# Patient Record
Sex: Female | Born: 1955 | Race: White | Hispanic: No | Marital: Married | Smoking: Never smoker
Health system: Southern US, Community
[De-identification: ages and names within clinical notes are randomized; demographics above are authoritative.]

## PROBLEM LIST (undated history)

## (undated) DIAGNOSIS — Z86010 Personal history of colonic polyps: Secondary | ICD-10-CM

## (undated) DIAGNOSIS — J309 Allergic rhinitis, unspecified: Secondary | ICD-10-CM

## (undated) DIAGNOSIS — J45909 Unspecified asthma, uncomplicated: Secondary | ICD-10-CM

## (undated) DIAGNOSIS — L570 Actinic keratosis: Secondary | ICD-10-CM

## (undated) DIAGNOSIS — K5730 Diverticulosis of large intestine without perforation or abscess without bleeding: Secondary | ICD-10-CM

## (undated) DIAGNOSIS — J069 Acute upper respiratory infection, unspecified: Secondary | ICD-10-CM

## (undated) DIAGNOSIS — E079 Disorder of thyroid, unspecified: Secondary | ICD-10-CM

## (undated) DIAGNOSIS — I10 Essential (primary) hypertension: Secondary | ICD-10-CM

## (undated) HISTORY — DX: Actinic keratosis: L57.0

## (undated) HISTORY — DX: Essential (primary) hypertension: I10

## (undated) HISTORY — DX: Allergic rhinitis, unspecified: J30.9

## (undated) HISTORY — DX: Personal history of colonic polyps: Z86.010

## (undated) HISTORY — DX: Acute upper respiratory infection, unspecified: J06.9

## (undated) HISTORY — DX: Unspecified asthma, uncomplicated: J45.909

## (undated) HISTORY — DX: Disorder of thyroid, unspecified: E07.9

## (undated) HISTORY — DX: Diverticulosis of large intestine without perforation or abscess without bleeding: K57.30

---

## 1997-07-10 ENCOUNTER — Ambulatory Visit (HOSPITAL_COMMUNITY): Admission: RE | Admit: 1997-07-10 | Discharge: 1997-07-10 | Payer: Self-pay | Admitting: Internal Medicine

## 1999-05-02 ENCOUNTER — Encounter: Admission: RE | Admit: 1999-05-02 | Discharge: 1999-05-02 | Payer: Self-pay | Admitting: Gynecology

## 1999-05-02 ENCOUNTER — Encounter: Payer: Self-pay | Admitting: Gynecology

## 2001-01-18 ENCOUNTER — Other Ambulatory Visit: Admission: RE | Admit: 2001-01-18 | Discharge: 2001-01-18 | Payer: Self-pay | Admitting: Gynecology

## 2003-02-18 ENCOUNTER — Encounter: Admission: RE | Admit: 2003-02-18 | Discharge: 2003-02-18 | Payer: Self-pay | Admitting: Gynecology

## 2003-03-18 ENCOUNTER — Other Ambulatory Visit: Admission: RE | Admit: 2003-03-18 | Discharge: 2003-03-18 | Payer: Self-pay | Admitting: Gynecology

## 2003-12-21 ENCOUNTER — Ambulatory Visit: Payer: Self-pay | Admitting: Family Medicine

## 2004-01-02 ENCOUNTER — Ambulatory Visit: Payer: Self-pay | Admitting: Family Medicine

## 2004-08-04 ENCOUNTER — Encounter: Admission: RE | Admit: 2004-08-04 | Discharge: 2004-08-04 | Payer: Self-pay | Admitting: Gynecology

## 2004-08-10 ENCOUNTER — Other Ambulatory Visit: Admission: RE | Admit: 2004-08-10 | Discharge: 2004-08-10 | Payer: Self-pay | Admitting: Gynecology

## 2004-08-11 ENCOUNTER — Encounter: Admission: RE | Admit: 2004-08-11 | Discharge: 2004-08-11 | Payer: Self-pay | Admitting: Gynecology

## 2005-09-13 ENCOUNTER — Ambulatory Visit: Payer: Self-pay | Admitting: Internal Medicine

## 2005-11-02 ENCOUNTER — Ambulatory Visit: Payer: Self-pay | Admitting: Internal Medicine

## 2005-11-02 LAB — CONVERTED CEMR LAB
AST: 25 units/L (ref 0–37)
BUN: 12 mg/dL (ref 6–23)
Basophils Relative: 1.6 % — ABNORMAL HIGH (ref 0.0–1.0)
Calcium: 9.2 mg/dL (ref 8.4–10.5)
Chloride: 104 meq/L (ref 96–112)
Creatinine, Ser: 0.9 mg/dL (ref 0.4–1.2)
Eosinophil percent: 4.5 % (ref 0.0–5.0)
HCT: 38.9 % (ref 36.0–46.0)
HDL: 99.2 mg/dL (ref >39.0)
Hemoglobin: 12.9 g/dL (ref 12.0–15.0)
LDL DIRECT: 106.3 mg/dL
Lymphocytes Relative: 27.6 % (ref 12.0–46.0)
MCHC: 33.3 g/dL (ref 30.0–36.0)
MCV: 92.1 fL (ref 78.0–100.0)
Monocytes Absolute: 0.3 10*3/uL (ref 0.2–0.7)
Neutro Abs: 1.9 10*3/uL (ref 1.4–7.7)
Neutrophils Relative %: 56.8 % (ref 43.0–77.0)
VLDL: 16 mg/dL (ref 0–40)

## 2005-11-09 ENCOUNTER — Ambulatory Visit: Payer: Self-pay | Admitting: Internal Medicine

## 2006-01-23 ENCOUNTER — Ambulatory Visit: Payer: Self-pay | Admitting: Gastroenterology

## 2006-03-13 ENCOUNTER — Encounter (INDEPENDENT_AMBULATORY_CARE_PROVIDER_SITE_OTHER): Payer: Self-pay | Admitting: Specialist

## 2006-03-13 ENCOUNTER — Ambulatory Visit: Payer: Self-pay | Admitting: Gastroenterology

## 2006-05-08 ENCOUNTER — Ambulatory Visit: Payer: Self-pay | Admitting: Internal Medicine

## 2006-11-07 DIAGNOSIS — K5730 Diverticulosis of large intestine without perforation or abscess without bleeding: Secondary | ICD-10-CM

## 2006-11-07 DIAGNOSIS — J45909 Unspecified asthma, uncomplicated: Secondary | ICD-10-CM

## 2006-11-07 DIAGNOSIS — J3089 Other allergic rhinitis: Secondary | ICD-10-CM | POA: Insufficient documentation

## 2006-11-07 DIAGNOSIS — Z860100 Personal history of colon polyps, unspecified: Secondary | ICD-10-CM

## 2006-11-07 DIAGNOSIS — J4530 Mild persistent asthma, uncomplicated: Secondary | ICD-10-CM | POA: Insufficient documentation

## 2006-11-07 DIAGNOSIS — J309 Allergic rhinitis, unspecified: Secondary | ICD-10-CM

## 2006-11-07 DIAGNOSIS — J4540 Moderate persistent asthma, uncomplicated: Secondary | ICD-10-CM

## 2006-11-07 HISTORY — DX: Diverticulosis of large intestine without perforation or abscess without bleeding: K57.30

## 2006-11-07 HISTORY — DX: Unspecified asthma, uncomplicated: J45.909

## 2006-11-07 HISTORY — DX: Allergic rhinitis, unspecified: J30.9

## 2006-11-07 HISTORY — DX: Personal history of colonic polyps: Z86.010

## 2006-11-07 HISTORY — DX: Personal history of colon polyps, unspecified: Z86.0100

## 2006-11-13 ENCOUNTER — Ambulatory Visit: Payer: Self-pay | Admitting: Internal Medicine

## 2006-11-13 LAB — CONVERTED CEMR LAB
ALT: 20 units/L (ref 0–35)
AST: 19 units/L (ref 0–37)
Albumin: 4 g/dL (ref 3.5–5.2)
Alkaline Phosphatase: 61 units/L (ref 39–117)
BUN: 18 mg/dL (ref 6–23)
Basophils Absolute: 0 10*3/uL (ref 0.0–0.1)
Bilirubin Urine: NEGATIVE
Blood in Urine, dipstick: NEGATIVE
Calcium: 9.6 mg/dL (ref 8.4–10.5)
Chloride: 104 meq/L (ref 96–112)
Cholesterol: 245 mg/dL (ref 0–200)
Eosinophils Absolute: 0.4 10*3/uL (ref 0.0–0.6)
Eosinophils Relative: 11.3 % — ABNORMAL HIGH (ref 0.0–5.0)
GFR calc Af Amer: 85 mL/min
GFR calc non Af Amer: 70 mL/min
Glucose, Bld: 104 mg/dL — ABNORMAL HIGH (ref 70–99)
HDL: 89.5 mg/dL (ref >39.0)
Ketones, urine, test strip: NEGATIVE
MCV: 91.1 fL (ref 78.0–100.0)
Monocytes Relative: 9.1 % (ref 3.0–11.0)
Neutro Abs: 2.1 10*3/uL (ref 1.4–7.7)
Nitrite: NEGATIVE
Platelets: 229 10*3/uL (ref 150–400)
RBC: 4.3 M/uL (ref 3.87–5.11)
Triglycerides: 125 mg/dL (ref 0–149)
Urobilinogen, UA: 0.2
WBC: 3.8 10*3/uL — ABNORMAL LOW (ref 4.5–10.5)

## 2006-12-03 ENCOUNTER — Ambulatory Visit: Payer: Self-pay | Admitting: Internal Medicine

## 2006-12-13 ENCOUNTER — Telehealth: Payer: Self-pay | Admitting: Internal Medicine

## 2006-12-14 ENCOUNTER — Ambulatory Visit: Payer: Self-pay | Admitting: Internal Medicine

## 2007-01-10 ENCOUNTER — Ambulatory Visit: Payer: Self-pay | Admitting: Internal Medicine

## 2007-01-10 LAB — CONVERTED CEMR LAB: TSH: 2.86 microintl units/mL (ref 0.35–5.50)

## 2007-01-23 ENCOUNTER — Telehealth: Payer: Self-pay | Admitting: Internal Medicine

## 2007-09-26 ENCOUNTER — Ambulatory Visit: Payer: Self-pay | Admitting: Internal Medicine

## 2007-11-06 ENCOUNTER — Encounter: Payer: Self-pay | Admitting: Internal Medicine

## 2007-11-13 ENCOUNTER — Telehealth (INDEPENDENT_AMBULATORY_CARE_PROVIDER_SITE_OTHER): Payer: Self-pay | Admitting: *Deleted

## 2007-11-15 ENCOUNTER — Encounter: Admission: RE | Admit: 2007-11-15 | Discharge: 2007-11-15 | Payer: Self-pay | Admitting: Gynecology

## 2007-12-07 ENCOUNTER — Ambulatory Visit: Payer: Self-pay | Admitting: Family Medicine

## 2007-12-11 ENCOUNTER — Encounter: Payer: Self-pay | Admitting: Internal Medicine

## 2007-12-11 ENCOUNTER — Ambulatory Visit: Payer: Self-pay | Admitting: Internal Medicine

## 2008-01-10 ENCOUNTER — Ambulatory Visit: Payer: Self-pay | Admitting: Internal Medicine

## 2008-01-10 LAB — CONVERTED CEMR LAB
Basophils Absolute: 0 10*3/uL (ref 0.0–0.1)
Basophils Relative: 1.2 % (ref 0.0–3.0)
Bilirubin Urine: NEGATIVE
Bilirubin, Direct: 0.1 mg/dL (ref 0.0–0.3)
Calcium: 9.3 mg/dL (ref 8.4–10.5)
Cholesterol: 244 mg/dL (ref 0–200)
Creatinine, Ser: 0.9 mg/dL (ref 0.4–1.2)
Direct LDL: 118 mg/dL
Eosinophils Absolute: 0.3 10*3/uL (ref 0.0–0.7)
GFR calc non Af Amer: 70 mL/min
HDL: 100 mg/dL (ref >39.0)
Ketones, ur: NEGATIVE mg/dL
Leukocytes, UA: NEGATIVE
Lymphocytes Relative: 32.2 % (ref 12.0–46.0)
MCHC: 35.2 g/dL (ref 30.0–36.0)
MCV: 89.5 fL (ref 78.0–100.0)
Neutro Abs: 1.7 10*3/uL (ref 1.4–7.7)
Neutrophils Relative %: 49.7 % (ref 43.0–77.0)
RBC: 4.26 M/uL (ref 3.87–5.11)
RDW: 12.5 % (ref 11.5–14.6)
Sodium: 143 meq/L (ref 135–145)
Specific Gravity, Urine: 1.02 (ref 1.000–1.03)
TSH: 3.74 microintl units/mL (ref 0.35–5.50)
Total Bilirubin: 0.7 mg/dL (ref 0.3–1.2)
Total CHOL/HDL Ratio: 2.4
Triglycerides: 93 mg/dL (ref 0–149)
Urine Glucose: NEGATIVE mg/dL
Urobilinogen, UA: 0.2 (ref 0.0–1.0)
VLDL: 19 mg/dL (ref 0–40)

## 2008-01-17 ENCOUNTER — Ambulatory Visit: Payer: Self-pay | Admitting: Internal Medicine

## 2008-01-17 DIAGNOSIS — L570 Actinic keratosis: Secondary | ICD-10-CM

## 2008-01-17 HISTORY — DX: Actinic keratosis: L57.0

## 2008-01-17 LAB — HM MAMMOGRAPHY: HM Mammogram: NORMAL

## 2008-01-20 ENCOUNTER — Telehealth: Payer: Self-pay | Admitting: Internal Medicine

## 2008-04-06 ENCOUNTER — Telehealth: Payer: Self-pay | Admitting: Internal Medicine

## 2008-05-07 ENCOUNTER — Telehealth: Payer: Self-pay | Admitting: Internal Medicine

## 2008-06-08 ENCOUNTER — Ambulatory Visit: Payer: Self-pay | Admitting: Internal Medicine

## 2008-09-22 ENCOUNTER — Telehealth (INDEPENDENT_AMBULATORY_CARE_PROVIDER_SITE_OTHER): Payer: Self-pay | Admitting: *Deleted

## 2009-03-22 ENCOUNTER — Ambulatory Visit: Payer: Self-pay | Admitting: Family Medicine

## 2009-08-24 ENCOUNTER — Telehealth: Payer: Self-pay | Admitting: Internal Medicine

## 2009-11-15 ENCOUNTER — Telehealth: Payer: Self-pay | Admitting: Internal Medicine

## 2010-01-29 ENCOUNTER — Encounter: Payer: Self-pay | Admitting: *Deleted

## 2010-01-30 LAB — CONVERTED CEMR LAB: Pap Smear: NORMAL

## 2010-01-31 ENCOUNTER — Encounter: Payer: Self-pay | Admitting: Internal Medicine

## 2010-02-01 NOTE — Progress Notes (Signed)
Summary: Physical results.  Phone Note Call from Patient   Caller: Patient Call For: Stacie Glaze MD Summary of Call: Pt and husband needs information off of last physical including labs, pulse rate, BP, Total Chol, HDL, LDL, glucose, Trig, Body Percent Fat.  Call when ready, please. Husband is Markus Daft.  This is for Health Assessment at work.  [phone] Initial call taken by: Lynann Beaver CMA,  August 24, 2009 2:50 PM  Follow-up for Phone Call        pt aware- they are ready for pick up Follow-up by: Willy Eddy, LPN,  August 24, 2009 3:30 PM

## 2010-02-01 NOTE — Progress Notes (Signed)
Summary: Pt req new script for Nasonex to [facility]  Phone Note Refill Request Message from:  Patient on November 15, 2009 9:14 AM  Refills Requested: Medication #1:  NASONEX 50 MCG/ACT  SUSP 2 spray  each nostril two times a day   Dosage confirmed as above?Dosage Confirmed  Method Requested: Telephone to [facility] Initial call taken by: Lucy Antigua,  November 15, 2009 9:14 AM    Prescriptions: NASONEX 50 MCG/ACT  SUSP (MOMETASONE FUROATE) 2 spray  each nostril two times a day  #1 x 2   Entered by:   Lynann Beaver CMA AAMA   Authorized by:   Stacie Glaze MD   Signed by:   Lynann Beaver CMA AAMA on 11/15/2009   Method used:   Electronically to        [facility]* (retail)             ,          Ph: [phone]       Fax: [phone]   RxID:   [id number]

## 2010-02-01 NOTE — Assessment & Plan Note (Signed)
Summary: COUGH, CONGESTION // RS   Vital Signs:  Patient profile:   55 year old female Temp:     99.9 degrees F BP sitting:   160 / 84  History of Present Illness: Patient has history of asthma. Onset over one week ago of cough with clear sputum and clear nasal mucus. Husband and one of her children had very similar symptoms. She has history of asthma and has recently over the past day started taking Qvar twice daily. No use of rescue inhaler. No significant dyspnea and denies any fever or chills.  Patient has some allergy symptoms generally this time of year. Taking Clarinex inconsistently and currently off Astelin.  Allergies: No Known Drug Allergies  Past History:  Past Medical History: Last updated: 11/07/2006 Allergic rhinitis Asthma Colonic polyps, hx of Diverticulosis, colon  Family History: Last updated: 09/26/2007 father with gout and Family History of Arthritis mother alive and well father has a bladder diverticula and now bladder cancer  Social History: Last updated: 01/10/2007 Married Never Smoked PMH-FH-SH reviewed for relevance  Review of Systems  The patient denies fever, chest pain, syncope, dyspnea on exertion, peripheral edema, prolonged cough, and hemoptysis.    Physical Exam  General:  Well-developed,well-nourished,in no acute distress; alert,appropriate and cooperative throughout examination coughing off and on during exam Ears:  small left serous effusion. Right eardrum normal Nose:  External nasal examination shows no deformity or inflammation. Nasal mucosa are pink and moist without lesions or exudates. Mouth:  Oral mucosa and oropharynx without lesions or exudates.  Teeth in good repair. Neck:  No deformities, masses, or tenderness noted. Lungs:  few very faint wheezes noted. No rales. No retractions. Symmetric breath sounds. Heart:  Normal rate and regular rhythm. S1 and S2 normal without gallop, murmur, click, rub or other extra  sounds.   Impression & Recommendations:  Problem # 1:  ASTHMA (ICD-493.90) suspect viral bronchitis trigger. No respiratory distress. Increase Qvar to 80 micrograms twice daily. Get back on regular use of Astelin and Clarinex. Refill Proventil 4 p.r.n. use The following medications were removed from the medication list:    Qvar 40 Mcg/act Aers (Beclomethasone dipropionate) ..... One puff by mouth daily Her updated medication list for this problem includes:    Qvar 80 Mcg/act Aers (Beclomethasone dipropionate) .Marland Kitchen... 1 puff two times a day    Proventil Hfa 108 (90 Base) Mcg/act Aers (Albuterol sulfate) .Marland Kitchen... 2 puffs every 4 hours as needed  Problem # 2:  ACUTE SEROUS OTITIS MEDIA (ICD-381.01) suspect related to recent virus. Observe for now  Complete Medication List: 1)  Clarinex 5 Mg Tabs (Desloratadine) .Marland Kitchen.. 1 once daily 2)  Nasonex 50 Mcg/act Susp (Mometasone furoate) .... 2 spray  each nostril two times a day 3)  Qvar 80 Mcg/act Aers (Beclomethasone dipropionate) .Marland Kitchen.. 1 puff two times a day 4)  Astelin 137 Mcg/spray Soln (Azelastine hcl) .... One spray once daily as needed 5)  Caltrate 600+d Plus 600-400 Mg-unit Tabs (Calcium carbonate-vit d-min) .... One by mouth bid 6)  Proventil Hfa 108 (90 Base) Mcg/act Aers (Albuterol sulfate) .... 2 puffs every 4 hours as needed  Patient Instructions: 1)  Increase Qvar to one puff twice daily 2)  use Proventil inhaler as needed for cough and wheezing 3)  Follow up immediately if you develop any fever or worsening respiratory symptoms 4)  Get back on regular use of Astelin and Clarinex Prescriptions: PROVENTIL HFA 108 (90 BASE) MCG/ACT AERS (ALBUTEROL SULFATE) 2 puffs every 4 hours as  needed  #1 x 1   Entered and Authorized by:   Evelena Peat MD   Signed by:   Evelena Peat MD on 03/22/2009   Method used:   Electronically to        [facility]* (retail)       [address]       [place], [place]   [address]       Ph: [phone] or [phone]       Fax: [phone]   RxID:   [id number]

## 2010-02-02 ENCOUNTER — Ambulatory Visit: Admit: 2010-02-02 | Payer: Self-pay | Admitting: Internal Medicine

## 2010-02-02 ENCOUNTER — Other Ambulatory Visit: Payer: Self-pay

## 2010-02-03 ENCOUNTER — Other Ambulatory Visit: Payer: Self-pay | Admitting: Internal Medicine

## 2010-02-03 ENCOUNTER — Encounter (INDEPENDENT_AMBULATORY_CARE_PROVIDER_SITE_OTHER): Payer: Self-pay | Admitting: *Deleted

## 2010-02-03 ENCOUNTER — Other Ambulatory Visit: Payer: BC Managed Care – PPO

## 2010-02-03 DIAGNOSIS — E785 Hyperlipidemia, unspecified: Secondary | ICD-10-CM

## 2010-02-03 DIAGNOSIS — Z0000 Encounter for general adult medical examination without abnormal findings: Secondary | ICD-10-CM

## 2010-02-03 LAB — BASIC METABOLIC PANEL
BUN: 21 mg/dL (ref 6–23)
Chloride: 102 mEq/L (ref 96–112)
Potassium: 4.8 mEq/L (ref 3.5–5.1)
Sodium: 138 mEq/L (ref 135–145)

## 2010-02-03 LAB — CBC WITH DIFFERENTIAL/PLATELET
Basophils Relative: 0.9 % (ref 0.0–3.0)
Eosinophils Relative: 8.1 % — ABNORMAL HIGH (ref 0.0–5.0)
HCT: 40.4 % (ref 36.0–46.0)
Hemoglobin: 14.4 g/dL (ref 12.0–15.0)
Lymphs Abs: 1.2 10*3/uL (ref 0.7–4.0)
MCV: 91.9 fl (ref 78.0–100.0)
Monocytes Absolute: 0.3 10*3/uL (ref 0.1–1.0)
Monocytes Relative: 7.9 % (ref 3.0–12.0)
Neutro Abs: 2.2 10*3/uL (ref 1.4–7.7)
Platelets: 204 10*3/uL (ref 150.0–400.0)
RBC: 4.4 Mil/uL (ref 3.87–5.11)
WBC: 4.1 10*3/uL — ABNORMAL LOW (ref 4.5–10.5)

## 2010-02-03 LAB — LIPID PANEL
HDL: 101.8 mg/dL (ref >39.00)
Triglycerides: 138 mg/dL (ref 0.0–149.0)

## 2010-02-03 LAB — URINALYSIS
Hgb urine dipstick: NEGATIVE
Total Protein, Urine: NEGATIVE
Urine Glucose: NEGATIVE
pH: 6 (ref 5.0–8.0)

## 2010-02-03 LAB — LDL CHOLESTEROL, DIRECT: Direct LDL: 120.3 mg/dL

## 2010-02-03 LAB — HEPATIC FUNCTION PANEL
ALT: 19 U/L (ref 0–35)
AST: 20 U/L (ref 0–37)
Albumin: 4.2 g/dL (ref 3.5–5.2)
Total Protein: 6.6 g/dL (ref 6.0–8.3)

## 2010-02-03 LAB — TSH: TSH: 5.05 u[IU]/mL (ref 0.35–5.50)

## 2010-02-09 ENCOUNTER — Encounter: Payer: Self-pay | Admitting: Internal Medicine

## 2010-05-18 ENCOUNTER — Encounter: Payer: Self-pay | Admitting: Internal Medicine

## 2010-06-30 ENCOUNTER — Telehealth: Payer: Self-pay | Admitting: Internal Medicine

## 2010-06-30 NOTE — Telephone Encounter (Signed)
Will have to ask dr Lovell Sheehan when he returns Friday- not listed anywhere inc hart

## 2010-06-30 NOTE — Telephone Encounter (Signed)
Pt is req a renewal of Ambien (unsure of dosage amount) Pt said that she had gotten script for this from Dr Lovell Sheehan a few years ago, when she was traveling to [place]. Pls call in to [facility] on [address].

## 2010-07-01 MED ORDER — ZOLPIDEM TARTRATE ER 12.5 MG PO TBCR: 12.5000 mg | EXTENDED_RELEASE_TABLET | Freq: Every evening | ORAL | Status: DC | PRN

## 2010-07-01 NOTE — Telephone Encounter (Signed)
may have ambien  12.5mg  1 qhs prn #30 with 1 refill and must have ov visit before anymore refills- has not been seen since 03-2010 per dr Lovell Sheehan

## 2010-07-01 NOTE — Telephone Encounter (Signed)
Left message for pt to pick up meds

## 2010-08-01 ENCOUNTER — Ambulatory Visit (INDEPENDENT_AMBULATORY_CARE_PROVIDER_SITE_OTHER): Payer: BC Managed Care – PPO | Admitting: Internal Medicine

## 2010-08-01 ENCOUNTER — Encounter: Payer: Self-pay | Admitting: Internal Medicine

## 2010-08-01 VITALS — BP 144/82 | HR 76 | Temp 98.2°F | Resp 14 | Ht 65.5 in | Wt 194.0 lb

## 2010-08-01 DIAGNOSIS — Z0000 Encounter for general adult medical examination without abnormal findings: Secondary | ICD-10-CM

## 2010-08-01 MED ORDER — AZELASTINE HCL 0.1 % NA SOLN: 1.0000 | Freq: Every day | NASAL | Status: DC | PRN

## 2010-08-01 MED ORDER — BECLOMETHASONE DIPROPIONATE 80 MCG/ACT IN AERS: 1.0000 | INHALATION_SPRAY | Freq: Two times a day (BID) | RESPIRATORY_TRACT | Status: DC

## 2010-08-01 MED ORDER — ZOLPIDEM TARTRATE ER 12.5 MG PO TBCR: 12.5000 mg | EXTENDED_RELEASE_TABLET | Freq: Every evening | ORAL | Status: DC | PRN

## 2010-08-01 MED ORDER — BLACK COHOSH 20 MG PO TABS: 1.0000 | ORAL_TABLET | Freq: Two times a day (BID) | ORAL | Status: DC

## 2010-08-01 NOTE — Patient Instructions (Signed)
Take black cohosh twice a day and consider adding a glass of soy milk at night

## 2010-08-03 ENCOUNTER — Encounter: Payer: Self-pay | Admitting: Internal Medicine

## 2010-08-03 NOTE — Progress Notes (Signed)
  Subjective:    Patient ID: Jasmin Small, female    DOB: [birth year], 55 y.o.   MRN: [id number]  HPI  cpx  Review of Systems  Constitutional: Negative for activity change, appetite change and fatigue.  HENT: Negative for ear pain, congestion, neck pain, postnasal drip and sinus pressure.   Eyes: Negative for redness and visual disturbance.  Respiratory: Negative for cough, shortness of breath and wheezing.   Gastrointestinal: Negative for abdominal pain and abdominal distention.  Genitourinary: Negative for dysuria, frequency and menstrual problem.  Musculoskeletal: Negative for myalgias, joint swelling and arthralgias.  Skin: Negative for rash and wound.  Neurological: Negative for dizziness, weakness and headaches.  Hematological: Negative for adenopathy. Does not bruise/bleed easily.  Psychiatric/Behavioral: Negative for sleep disturbance and decreased concentration.       Objective:   Physical Exam  Nursing note and vitals reviewed. Constitutional: She is oriented to person, place, and time. She appears well-developed and well-nourished. No distress.  HENT:  Head: Normocephalic and atraumatic.  Right Ear: External ear normal.  Left Ear: External ear normal.  Nose: Nose normal.  Mouth/Throat: Oropharynx is clear and moist.  Eyes: Conjunctivae and EOM are normal. Pupils are equal, round, and reactive to light.  Neck: Normal range of motion. Neck supple. No JVD present. No tracheal deviation present. No thyromegaly present.  Cardiovascular: Normal rate, regular rhythm, normal heart sounds and intact distal pulses.   No murmur heard. Pulmonary/Chest: Effort normal and breath sounds normal. She has no wheezes. She exhibits no tenderness.  Abdominal: Soft. Bowel sounds are normal.  Musculoskeletal: Normal range of motion. She exhibits no edema and no tenderness.  Lymphadenopathy:    She has no cervical adenopathy.  Neurological: She is alert and oriented to person, place, and  time. She has normal reflexes. No cranial nerve deficit.  Skin: Skin is warm and dry. She is not diaphoretic.  Psychiatric: She has a normal mood and affect. Her behavior is normal.          Assessment & Plan:   This is a routine physical examination for this healthy  Female. Reviewed all health maintenance protocols including mammography colonoscopy bone density and reviewed appropriate screening labs. Her immunization history was reviewed as well as her current medications and allergies refills of her chronic medications were given and the plan for yearly health maintenance was discussed all orders and referrals were made as appropriate.

## 2010-08-22 ENCOUNTER — Ambulatory Visit: Payer: BC Managed Care – PPO | Admitting: Family Medicine

## 2010-08-22 ENCOUNTER — Telehealth: Payer: Self-pay | Admitting: Internal Medicine

## 2010-08-22 MED ORDER — DESLORATADINE 5 MG PO TABS: 5.0000 mg | ORAL_TABLET | Freq: Every day | ORAL | Status: DC

## 2010-08-22 NOTE — Telephone Encounter (Signed)
Pt would like refill of Clarinex. [facility] said she needed to contact us before they could refill it. Please submit.

## 2010-08-22 NOTE — Telephone Encounter (Signed)
Sent in electronically .  

## 2010-11-01 ENCOUNTER — Ambulatory Visit: Payer: BC Managed Care – PPO | Admitting: Internal Medicine

## 2010-12-01 ENCOUNTER — Telehealth: Payer: Self-pay | Admitting: Internal Medicine

## 2010-12-01 MED ORDER — AZITHROMYCIN 250 MG PO TABS: ORAL_TABLET | ORAL | Status: AC

## 2010-12-01 NOTE — Telephone Encounter (Signed)
Pt called and has ov sch for 12/07/10 with Dr Lovell Sheehan. Pt said that she has cough, chest congestion, headache, stuffy nose, and is req either a work in ov sooner or and abx to be called in to [facility] on [address].

## 2010-12-01 NOTE — Telephone Encounter (Signed)
Rx sent and Left message on machine for patient. 

## 2010-12-01 NOTE — Telephone Encounter (Signed)
Per dr Lovell Sheehan- m ay have z pack and muciknex fast max otc

## 2010-12-07 ENCOUNTER — Ambulatory Visit: Payer: BC Managed Care – PPO | Admitting: Internal Medicine

## 2010-12-09 ENCOUNTER — Encounter: Payer: Self-pay | Admitting: Internal Medicine

## 2010-12-09 ENCOUNTER — Ambulatory Visit (INDEPENDENT_AMBULATORY_CARE_PROVIDER_SITE_OTHER): Payer: BC Managed Care – PPO | Admitting: Internal Medicine

## 2010-12-09 VITALS — BP 136/84 | HR 72 | Temp 98.2°F | Resp 16 | Ht 65.5 in | Wt 194.0 lb

## 2010-12-09 DIAGNOSIS — Z0000 Encounter for general adult medical examination without abnormal findings: Secondary | ICD-10-CM

## 2010-12-09 DIAGNOSIS — J708 Respiratory conditions due to other specified external agents: Secondary | ICD-10-CM

## 2010-12-09 DIAGNOSIS — R635 Abnormal weight gain: Secondary | ICD-10-CM

## 2010-12-09 DIAGNOSIS — J45909 Unspecified asthma, uncomplicated: Secondary | ICD-10-CM

## 2010-12-09 MED ORDER — DESLORATADINE 5 MG PO TABS: 5.0000 mg | ORAL_TABLET | Freq: Every day | ORAL | Status: DC

## 2010-12-09 NOTE — Patient Instructions (Signed)
The patient is instructed to continue all medications as prescribed. Schedule followup with check out clerk upon leaving the clinic  

## 2010-12-09 NOTE — Progress Notes (Signed)
Subjective:    Patient ID: Jasmin Small, female    DOB: [birth year], 55 y.o.   MRN: [id number]  HPI Follow up of asthma, weight gain, chronic allergies Patient was going up a flight house developed an upper respiratory tract infection was given a Z-Pak symptoms have improved she has persistent cough without wheezing and she has postnasal drip   Review of Systems  Constitutional: Negative for activity change, appetite change and fatigue.  HENT: Negative for ear pain, congestion, neck pain, postnasal drip and sinus pressure.   Eyes: Negative for redness and visual disturbance.  Respiratory: Negative for cough, shortness of breath and wheezing.   Gastrointestinal: Negative for abdominal pain and abdominal distention.  Genitourinary: Negative for dysuria, frequency and menstrual problem.  Musculoskeletal: Negative for myalgias, joint swelling and arthralgias.  Skin: Negative for rash and wound.  Neurological: Negative for dizziness, weakness and headaches.  Hematological: Negative for adenopathy. Does not bruise/bleed easily.  Psychiatric/Behavioral: Negative for sleep disturbance and decreased concentration.   Past Medical History  Diagnosis Date  . ALLERGIC RHINITIS 11/07/2006  . ASTHMA 11/07/2006  . COLONIC POLYPS, HX OF 11/07/2006  . DIVERTICULOSIS, COLON 11/07/2006  . Actinic keratosis 01/17/2008    History   Social History  . Marital Status: Married    Spouse Name: N/A    Number of Children: N/A  . Years of Education: N/A   Occupational History  . Not on file.   Social History Main Topics  . Smoking status: Never Smoker   . Smokeless tobacco: Not on file  . Alcohol Use: No  . Drug Use: No  . Sexually Active: Yes   Other Topics Concern  . Not on file   Social History Narrative  . No narrative on file    Past Surgical History  Procedure Date  . Cesarean section     Family History  Problem Relation Age of Onset  . Gout Father   . Arthritis    . Cancer Father      No Known Allergies  Current Outpatient Prescriptions on File Prior to Visit  Medication Sig Dispense Refill  . albuterol (PROVENTIL HFA;VENTOLIN HFA) 108 (90 BASE) MCG/ACT inhaler Inhale 2 puffs into the lungs every 6 (six) hours as needed.        Marland Kitchen azelastine (ASTELIN) 137 MCG/SPRAY nasal spray Place 1 spray into the nose daily as needed. Use in each nostril as directed  90 mL  3  . beclomethasone (QVAR) 80 MCG/ACT inhaler Inhale 1 puff into the lungs 2 (two) times daily.  26.1 Inhaler  3  . Black Cohosh 20 MG TABS Take 1 tablet (20 mg total) by mouth 2 (two) times daily.  60 each  0  . Calcium Carbonate-Vitamin D (CALTRATE 600+D) 600-400 MG-UNIT per tablet Take 1 tablet by mouth 2 (two) times daily.        . mometasone (NASONEX) 50 MCG/ACT nasal spray 2 sprays by Nasal route 2 (two) times daily.        Marland Kitchen zolpidem (AMBIEN CR) 12.5 MG CR tablet Take 1 tablet (12.5 mg total) by mouth at bedtime as needed for sleep.  90 tablet  1    BP 136/84  Pulse 72  Temp 98.2 F (36.8 C)  Resp 16  Ht 5' 5.5" (1.664 m)  Wt 194 lb (87.998 kg)  BMI 31.79 kg/m2       Objective:   Physical Exam  Nursing note and vitals reviewed. Constitutional: She is oriented to person, place, and  time. She appears well-developed and well-nourished. No distress.  HENT:  Head: Normocephalic and atraumatic.  Right Ear: External ear normal.  Left Ear: External ear normal.  Nose: Nose normal.  Mouth/Throat: Oropharynx is clear and moist.  Eyes: Conjunctivae and EOM are normal. Pupils are equal, round, and reactive to light.  Neck: Normal range of motion. Neck supple. No JVD present. No tracheal deviation present. No thyromegaly present.  Cardiovascular: Normal rate, regular rhythm, normal heart sounds and intact distal pulses.   No murmur heard. Pulmonary/Chest: Effort normal and breath sounds normal. She has no wheezes. She exhibits no tenderness.  Abdominal: Soft. Bowel sounds are normal.  Musculoskeletal:  Normal range of motion. She exhibits no edema and no tenderness.  Lymphadenopathy:    She has no cervical adenopathy.  Neurological: She is alert and oriented to person, place, and time. She has normal reflexes. No cranial nerve deficit.  Skin: Skin is warm and dry. She is not diaphoretic.  Psychiatric: She has a normal mood and affect. Her behavior is normal.          Assessment & Plan:  The patient is on prophylaxis with Qvar and Nasonex with good results.  Acute upper rectal retractor infection treated with a Z-Pak she uses Clarinex for allergic rhinitis and she has current mild to moderate flare of allergic rhinitis recommend increasing the Nasonex 2 twice a day for several weeks.Marland Kitchen Physical  in February or March

## 2011-02-06 ENCOUNTER — Other Ambulatory Visit: Payer: Self-pay | Admitting: Internal Medicine

## 2011-08-30 ENCOUNTER — Other Ambulatory Visit: Payer: Self-pay | Admitting: Internal Medicine

## 2011-08-30 MED ORDER — MOMETASONE FUROATE 50 MCG/ACT NA SUSP: 2.0000 | Freq: Every day | NASAL | Status: DC

## 2011-08-30 NOTE — Telephone Encounter (Signed)
Pt is aware.  

## 2011-08-30 NOTE — Telephone Encounter (Signed)
Please let pt know it was sent in-thanks

## 2011-08-30 NOTE — Telephone Encounter (Signed)
Pt needs nasonex #3 for [facility] Pt would like a callback

## 2011-09-08 ENCOUNTER — Other Ambulatory Visit: Payer: BC Managed Care – PPO

## 2011-09-11 ENCOUNTER — Other Ambulatory Visit: Payer: BC Managed Care – PPO

## 2011-09-15 ENCOUNTER — Encounter: Payer: BC Managed Care – PPO | Admitting: Internal Medicine

## 2011-10-24 ENCOUNTER — Other Ambulatory Visit: Payer: BC Managed Care – PPO

## 2011-10-30 ENCOUNTER — Encounter: Payer: BC Managed Care – PPO | Admitting: Internal Medicine

## 2011-11-09 ENCOUNTER — Other Ambulatory Visit: Payer: Self-pay | Admitting: Nurse Practitioner

## 2011-11-09 DIAGNOSIS — Z1231 Encounter for screening mammogram for malignant neoplasm of breast: Secondary | ICD-10-CM

## 2011-12-11 ENCOUNTER — Other Ambulatory Visit: Payer: Self-pay | Admitting: Internal Medicine

## 2011-12-18 ENCOUNTER — Ambulatory Visit
Admission: RE | Admit: 2011-12-18 | Discharge: 2011-12-18 | Disposition: A | Discharge status: Home / Self Care | Payer: BC Managed Care – PPO | Source: Ambulatory Visit | Attending: Nurse Practitioner | Admitting: Nurse Practitioner

## 2011-12-18 DIAGNOSIS — Z1231 Encounter for screening mammogram for malignant neoplasm of breast: Secondary | ICD-10-CM

## 2011-12-20 ENCOUNTER — Telehealth: Payer: Self-pay | Admitting: Internal Medicine

## 2011-12-20 MED ORDER — ZOLPIDEM TARTRATE 10 MG PO TABS: 10.0000 mg | ORAL_TABLET | Freq: Every evening | ORAL | Status: DC | PRN

## 2011-12-20 NOTE — Telephone Encounter (Signed)
#  30 with 1 refill of zolpidem 10mg  called [facility] [address]

## 2011-12-20 NOTE — Telephone Encounter (Signed)
Patient Information:  Caller Name: Tearsa  Phone: [phone]  Patient: Jasmin Small, Jasmin Small  Gender: Female  DOB: [birth year]  Age: 56 Years  PCP: Darryll Capers (Adults only)  Office Follow Up:  Does the office need to follow up with this patient?: Yes  Instructions For The Office: Please call concerning refill of Ambien  RN Note:  Patient calling for a refill of Ambien. States she uses [employer] which does help. She will be traveling and she has increasd problems at that time. Please call concerning if a refill can be sent in for Ambien  Symptoms  Reason For Call & Symptoms: insomnia especially when she travels and she is getting ready to go out of town.  Reviewed Health History In EMR: Yes  Reviewed Medications In EMR: Yes  Reviewed Allergies In EMR: Yes  Reviewed Surgeries / Procedures: Yes  Date of Onset of Symptoms: 12/10/2011  Treatments Tried: Black Cohosh  Treatments Tried Worked: Yes  Guideline(s) Used:  Insomnia  Disposition Per Guideline:   See Within 3 Days in Office  Reason For Disposition Reached:   Insomnia persists > 1 week and following Insomnia Care Advice  Advice Given:  Tips For Good Sleep:  Drink a small glass of warm milk at bedtime.  Take a warm bath or shower before bedtime.  Tips For Good Sleep - Your Bedroom:  Keep bedroom temperature cool, not warm or cold.  Call Back If:  You become worse.  Patient Refused Recommendation:  Patient Requests Prescription  Patient request refill  of Ambien

## 2012-01-11 ENCOUNTER — Other Ambulatory Visit (INDEPENDENT_AMBULATORY_CARE_PROVIDER_SITE_OTHER): Payer: BC Managed Care – PPO

## 2012-01-11 DIAGNOSIS — Z0000 Encounter for general adult medical examination without abnormal findings: Secondary | ICD-10-CM

## 2012-01-11 LAB — POCT URINALYSIS DIPSTICK
Bilirubin, UA: NEGATIVE
Glucose, UA: NEGATIVE
Ketones, UA: NEGATIVE
Leukocytes, UA: NEGATIVE
Protein, UA: NEGATIVE

## 2012-01-11 LAB — BASIC METABOLIC PANEL
BUN: 17 mg/dL (ref 6–23)
Calcium: 9.3 mg/dL (ref 8.4–10.5)
Chloride: 103 mEq/L (ref 96–112)
Creatinine, Ser: 0.8 mg/dL (ref 0.4–1.2)

## 2012-01-11 LAB — CBC WITH DIFFERENTIAL/PLATELET
Basophils Absolute: 0 10*3/uL (ref 0.0–0.1)
HCT: 40 % (ref 36.0–46.0)
Lymphs Abs: 1 10*3/uL (ref 0.7–4.0)
MCV: 89.8 fl (ref 78.0–100.0)
Monocytes Absolute: 0.3 10*3/uL (ref 0.1–1.0)
Neutrophils Relative %: 55.7 % (ref 43.0–77.0)
Platelets: 193 10*3/uL (ref 150.0–400.0)
RDW: 13.5 % (ref 11.5–14.6)
WBC: 3.8 10*3/uL — ABNORMAL LOW (ref 4.5–10.5)

## 2012-01-11 LAB — HEPATIC FUNCTION PANEL
ALT: 27 U/L (ref 0–35)
Bilirubin, Direct: 0.1 mg/dL (ref 0.0–0.3)
Total Bilirubin: 0.8 mg/dL (ref 0.3–1.2)
Total Protein: 7 g/dL (ref 6.0–8.3)

## 2012-01-11 LAB — LIPID PANEL
Cholesterol: 235 mg/dL — ABNORMAL HIGH (ref 0–200)
HDL: 94.5 mg/dL (ref >39.00)
Total CHOL/HDL Ratio: 2
Triglycerides: 103 mg/dL (ref 0.0–149.0)

## 2012-01-11 LAB — LDL CHOLESTEROL, DIRECT: Direct LDL: 115.6 mg/dL

## 2012-01-19 ENCOUNTER — Ambulatory Visit (INDEPENDENT_AMBULATORY_CARE_PROVIDER_SITE_OTHER): Payer: BC Managed Care – PPO | Admitting: Internal Medicine

## 2012-01-19 ENCOUNTER — Encounter: Payer: Self-pay | Admitting: Internal Medicine

## 2012-01-19 VITALS — BP 140/80 | HR 76 | Temp 98.6°F | Resp 16 | Ht 65.5 in | Wt 196.0 lb

## 2012-01-19 DIAGNOSIS — Z0000 Encounter for general adult medical examination without abnormal findings: Secondary | ICD-10-CM

## 2012-01-19 DIAGNOSIS — Z23 Encounter for immunization: Secondary | ICD-10-CM

## 2012-01-19 NOTE — Progress Notes (Signed)
Subjective:    Patient ID: Jasmin Small, female    DOB: 1955-07-29, 57 y.o.   MRN: [id number]  HPI CPX Mild increased congestion and cough for 3-4 weeks Yellow production cough Hoarse, fatigues   Review of Systems  Constitutional: Negative for activity change, appetite change and fatigue.  HENT: Positive for congestion, rhinorrhea and sinus pressure. Negative for ear pain, neck pain and postnasal drip.   Eyes: Negative for redness and visual disturbance.  Respiratory: Negative for cough, shortness of breath and wheezing.   Gastrointestinal: Negative for abdominal pain and abdominal distention.  Genitourinary: Negative for dysuria, frequency and menstrual problem.  Musculoskeletal: Negative for myalgias, joint swelling and arthralgias.  Skin: Negative for rash and wound.  Neurological: Positive for headaches. Negative for dizziness and weakness.  Hematological: Negative for adenopathy. Does not bruise/bleed easily.  Psychiatric/Behavioral: Negative for sleep disturbance and decreased concentration.   Past Medical History  Diagnosis Date  . ALLERGIC RHINITIS 11/07/2006  . ASTHMA 11/07/2006  . COLONIC POLYPS, HX OF 11/07/2006  . DIVERTICULOSIS, COLON 11/07/2006  . Actinic keratosis 01/17/2008    History   Social History  . Marital Status: Married    Spouse Name: N/A    Number of Children: N/A  . Years of Education: N/A   Occupational History  . Not on file.   Social History Main Topics  . Smoking status: Never Smoker   . Smokeless tobacco: Not on file  . Alcohol Use: No  . Drug Use: No  . Sexually Active: Yes   Other Topics Concern  . Not on file   Social History Narrative  . No narrative on file    Past Surgical History  Procedure Date  . Cesarean section     Family History  Problem Relation Age of Onset  . Gout Father   . Arthritis    . Cancer Father     No Known Allergies  Current Outpatient Prescriptions on File Prior to Visit  Medication Sig  Dispense Refill  . albuterol (PROVENTIL HFA;VENTOLIN HFA) 108 (90 BASE) MCG/ACT inhaler Inhale 2 puffs into the lungs every 6 (six) hours as needed.        Marland Kitchen azelastine (ASTELIN) 137 MCG/SPRAY nasal spray Place 1 spray into the nose daily as needed. Use in each nostril as directed  90 mL  3  . beclomethasone (QVAR) 80 MCG/ACT inhaler Inhale 1 puff into the lungs 2 (two) times daily.  26.1 Inhaler  3  . Black Cohosh 20 MG TABS Take 1 tablet (20 mg total) by mouth 2 (two) times daily.  60 each  0  . Calcium Carbonate-Vitamin D (CALTRATE 600+D) 600-400 MG-UNIT per tablet Take 1 tablet by mouth 2 (two) times daily.        Marland Kitchen desloratadine (CLARINEX) 5 MG tablet TAKE 1 TABLET DAILY  90 tablet  2  . mometasone (NASONEX) 50 MCG/ACT nasal spray Place 2 sprays into the nose daily.  51 g  3  . zolpidem (AMBIEN) 10 MG tablet Take 1 tablet (10 mg total) by mouth at bedtime as needed for sleep.  30 tablet  1    BP 140/80  Pulse 76  Temp 98.6 F (37 C)  Resp 16  Ht 5' 5.5" (1.664 m)  Wt 196 lb (88.905 kg)  BMI 32.12 kg/m2       Objective:   Physical Exam  Nursing note and vitals reviewed. Constitutional: She is oriented to person, place, and time. She appears well-developed and well-nourished. No distress.  HENT:  Head: Normocephalic and atraumatic.  Right Ear: External ear normal.  Left Ear: External ear normal.  Nose: Nose normal.  Mouth/Throat: Oropharynx is clear and moist.  Eyes: Conjunctivae normal and EOM are normal. Pupils are equal, round, and reactive to light.  Neck: Normal range of motion. Neck supple. No JVD present. No tracheal deviation present. No thyromegaly present.  Cardiovascular: Normal rate, regular rhythm, normal heart sounds and intact distal pulses.   No murmur heard. Pulmonary/Chest: Effort normal and breath sounds normal. She has no wheezes. She exhibits no tenderness.  Abdominal: Soft. Bowel sounds are normal.  Musculoskeletal: Normal range of motion. She exhibits  no edema and no tenderness.  Lymphadenopathy:    She has no cervical adenopathy.  Neurological: She is alert and oriented to person, place, and time. She has normal reflexes. No cranial nerve deficit.  Skin: Skin is warm and dry. She is not diaphoretic.  Psychiatric: She has a normal mood and affect. Her behavior is normal.          Assessment & Plan:  Bacterial sinusitis vs viral bronchitis Looks to be viral but we will monitor with her  This is a routine physical examination for this healthy  Female. Reviewed all health maintenance protocols including mammography colonoscopy bone density and reviewed appropriate screening labs. Her immunization history was reviewed as well as her current medications and allergies refills of her chronic medications were given and the plan for yearly health maintenance was discussed all orders and referrals were made as appropriate.

## 2012-01-19 NOTE — Patient Instructions (Signed)
The patient is instructed to continue all medications as prescribed. Schedule followup with check out clerk upon leaving the clinic  

## 2012-04-01 ENCOUNTER — Telehealth: Payer: Self-pay | Admitting: Internal Medicine

## 2012-04-01 NOTE — Telephone Encounter (Signed)
Please have them call travel [facility] -it is occupational medicine [phone]-they have all the answers

## 2012-04-01 NOTE — Telephone Encounter (Signed)
Spoke with patient and she will call back with dates of Hep A and B and if any other vaccine is needed

## 2012-04-01 NOTE — Telephone Encounter (Signed)
Pt is traveling to [place] at the end of April. Pt has heard it is end of Malaria season. Pt has heard conflicting reports on how to handle this. Would like you advice. Pt would like to know if she is up to date on vaccines. Pls review history and call pt.

## 2012-04-22 ENCOUNTER — Other Ambulatory Visit: Payer: Self-pay | Admitting: Internal Medicine

## 2012-08-14 ENCOUNTER — Telehealth: Payer: Self-pay | Admitting: Internal Medicine

## 2012-08-14 NOTE — Telephone Encounter (Signed)
Pt states [facility] has no record of her RX for beclomethasone (QVAR) 80 MCG/ACT inhaler.  Pt would like a refill of this called in there.. Pt would also like you to call in the QVAR to local pharm: [facility] on [address] because she needs asap.

## 2012-08-15 ENCOUNTER — Other Ambulatory Visit: Payer: Self-pay | Admitting: *Deleted

## 2012-08-15 MED ORDER — BECLOMETHASONE DIPROPIONATE 80 MCG/ACT IN AERS: 1.0000 | INHALATION_SPRAY | Freq: Two times a day (BID) | RESPIRATORY_TRACT | Status: DC

## 2012-08-15 NOTE — Telephone Encounter (Signed)
Done and Left message on machine for pt

## 2012-10-14 ENCOUNTER — Other Ambulatory Visit: Payer: Self-pay | Admitting: *Deleted

## 2012-11-22 ENCOUNTER — Telehealth: Payer: Self-pay | Admitting: Internal Medicine

## 2012-11-22 ENCOUNTER — Other Ambulatory Visit: Payer: Self-pay | Admitting: *Deleted

## 2012-11-22 MED ORDER — AZELASTINE HCL 0.1 % NA SOLN: 1.0000 | Freq: Every day | NASAL | Status: DC | PRN

## 2012-11-22 NOTE — Telephone Encounter (Signed)
Pt had to cxl her January CPX, can pt be seen sooner than this??  Also pt would like a Rx of Azelastine HCI, pt states Dr. Lovell Sheehan prescribed it in the past and would like to take it again for her allergies. Please advise.

## 2012-11-22 NOTE — Telephone Encounter (Signed)
We absolutely cant see her any sooner. Why did she cancel?  If she wants it earlier, padonda can do her cpx- also we will send the nasa spray.

## 2012-11-22 NOTE — Telephone Encounter (Signed)
Nasal spray was sent to [facility] on [address]

## 2012-12-09 NOTE — Telephone Encounter (Signed)
lmom informing pt to call the office to schedule her appointment with nurse practitioner.

## 2013-01-22 ENCOUNTER — Other Ambulatory Visit: Payer: BC Managed Care – PPO | Admitting: Internal Medicine

## 2013-01-22 ENCOUNTER — Other Ambulatory Visit: Payer: BC Managed Care – PPO

## 2013-01-29 ENCOUNTER — Encounter: Payer: BC Managed Care – PPO | Admitting: Internal Medicine

## 2013-04-14 ENCOUNTER — Other Ambulatory Visit: Payer: BC Managed Care – PPO

## 2013-04-14 ENCOUNTER — Encounter: Payer: Self-pay | Admitting: Internal Medicine

## 2013-04-15 ENCOUNTER — Other Ambulatory Visit (INDEPENDENT_AMBULATORY_CARE_PROVIDER_SITE_OTHER): Payer: BC Managed Care – PPO

## 2013-04-15 DIAGNOSIS — Z0000 Encounter for general adult medical examination without abnormal findings: Secondary | ICD-10-CM

## 2013-04-15 LAB — CBC WITH DIFFERENTIAL/PLATELET
BASOS ABS: 0 10*3/uL (ref 0.0–0.1)
Basophils Relative: 0.8 % (ref 0.0–3.0)
EOS ABS: 0.2 10*3/uL (ref 0.0–0.7)
Eosinophils Relative: 5 % (ref 0.0–5.0)
HEMATOCRIT: 41 % (ref 36.0–46.0)
HEMOGLOBIN: 13.7 g/dL (ref 12.0–15.0)
LYMPHS ABS: 1.6 10*3/uL (ref 0.7–4.0)
LYMPHS PCT: 36.2 % (ref 12.0–46.0)
MCHC: 33.4 g/dL (ref 30.0–36.0)
MCV: 91.2 fl (ref 78.0–100.0)
MONOS PCT: 9 % (ref 3.0–12.0)
Monocytes Absolute: 0.4 10*3/uL (ref 0.1–1.0)
NEUTROS ABS: 2.2 10*3/uL (ref 1.4–7.7)
Neutrophils Relative %: 49 % (ref 43.0–77.0)
PLATELETS: 207 10*3/uL (ref 150.0–400.0)
RBC: 4.49 Mil/uL (ref 3.87–5.11)
RDW: 13.8 % (ref 11.5–14.6)
WBC: 4.4 10*3/uL — ABNORMAL LOW (ref 4.5–10.5)

## 2013-04-15 LAB — POCT URINALYSIS DIPSTICK
Bilirubin, UA: NEGATIVE
Blood, UA: NEGATIVE
Glucose, UA: NEGATIVE
KETONES UA: NEGATIVE
Leukocytes, UA: NEGATIVE
Nitrite, UA: NEGATIVE
PH UA: 5.5
Protein, UA: NEGATIVE
Spec Grav, UA: 1.025
Urobilinogen, UA: 0.2

## 2013-04-15 LAB — LIPID PANEL
CHOLESTEROL: 232 mg/dL — AB (ref 0–200)
HDL: 95.2 mg/dL (ref >39.00)
LDL Cholesterol: 113 mg/dL — ABNORMAL HIGH (ref 0–99)
Total CHOL/HDL Ratio: 2
Triglycerides: 117 mg/dL (ref 0.0–149.0)
VLDL: 23.4 mg/dL (ref 0.0–40.0)

## 2013-04-15 LAB — TSH: TSH: 7.18 u[IU]/mL — ABNORMAL HIGH (ref 0.35–5.50)

## 2013-04-15 LAB — BASIC METABOLIC PANEL
BUN: 14 mg/dL (ref 6–23)
CALCIUM: 9.3 mg/dL (ref 8.4–10.5)
CO2: 28 meq/L (ref 19–32)
Chloride: 102 mEq/L (ref 96–112)
Creatinine, Ser: 0.7 mg/dL (ref 0.4–1.2)
GFR: 86.86 mL/min (ref >60.00)
GLUCOSE: 95 mg/dL (ref 70–99)
Potassium: 4.6 mEq/L (ref 3.5–5.1)
SODIUM: 138 meq/L (ref 135–145)

## 2013-04-15 LAB — HEPATIC FUNCTION PANEL
ALK PHOS: 58 U/L (ref 39–117)
ALT: 21 U/L (ref 0–35)
AST: 23 U/L (ref 0–37)
Albumin: 4 g/dL (ref 3.5–5.2)
BILIRUBIN DIRECT: 0 mg/dL (ref 0.0–0.3)
TOTAL PROTEIN: 6.8 g/dL (ref 6.0–8.3)
Total Bilirubin: 0.3 mg/dL (ref 0.3–1.2)

## 2013-04-21 ENCOUNTER — Ambulatory Visit (INDEPENDENT_AMBULATORY_CARE_PROVIDER_SITE_OTHER): Payer: BC Managed Care – PPO | Admitting: Internal Medicine

## 2013-04-21 ENCOUNTER — Encounter: Payer: Self-pay | Admitting: Internal Medicine

## 2013-04-21 VITALS — BP 126/86 | HR 64 | Temp 98.2°F | Ht 65.5 in | Wt 199.0 lb

## 2013-04-21 DIAGNOSIS — Z0000 Encounter for general adult medical examination without abnormal findings: Secondary | ICD-10-CM

## 2013-04-21 DIAGNOSIS — Z889 Allergy status to unspecified drugs, medicaments and biological substances status: Secondary | ICD-10-CM

## 2013-04-21 DIAGNOSIS — Z9109 Other allergy status, other than to drugs and biological substances: Secondary | ICD-10-CM

## 2013-04-21 DIAGNOSIS — E039 Hypothyroidism, unspecified: Secondary | ICD-10-CM

## 2013-04-21 MED ORDER — LEVOTHYROXINE SODIUM 75 MCG PO TABS: 75.0000 ug | ORAL_TABLET | Freq: Every day | ORAL | Status: DC

## 2013-04-21 MED ORDER — ALBUTEROL SULFATE HFA 108 (90 BASE) MCG/ACT IN AERS: 2.0000 | INHALATION_SPRAY | Freq: Four times a day (QID) | RESPIRATORY_TRACT | Status: DC | PRN

## 2013-04-21 MED ORDER — FEXOFENADINE-PSEUDOEPHED ER 60-120 MG PO TB12: 1.0000 | ORAL_TABLET | Freq: Two times a day (BID) | ORAL | Status: DC

## 2013-04-21 NOTE — Patient Instructions (Signed)
The patient is instructed to continue all medications as prescribed. Schedule followup with check out clerk upon leaving the clinic  

## 2013-04-21 NOTE — Progress Notes (Signed)
Pre visit review using our clinic review tool, if applicable. No additional management support is needed unless otherwise documented below in the visit note. 

## 2013-04-21 NOTE — Progress Notes (Signed)
   Subjective:    Patient ID: Jasmin Small, female    DOB: 06/18/1955, 58 y.o.   MRN: [id number]  HPI    Review of Systems  Constitutional: Negative for activity change, appetite change and fatigue.  HENT: Negative for congestion, ear pain, postnasal drip and sinus pressure.   Eyes: Negative for redness and visual disturbance.  Respiratory: Negative for cough, shortness of breath and wheezing.   Gastrointestinal: Negative for abdominal pain and abdominal distention.  Genitourinary: Negative for dysuria, frequency and menstrual problem.  Musculoskeletal: Negative for arthralgias, joint swelling, myalgias and neck pain.  Skin: Negative for rash and wound.  Neurological: Negative for dizziness, weakness and headaches.  Hematological: Negative for adenopathy. Does not bruise/bleed easily.  Psychiatric/Behavioral: Negative for sleep disturbance and decreased concentration.       Objective:   Physical Exam  Constitutional: She is oriented to person, place, and time. She appears well-developed and well-nourished. No distress.  HENT:  Head: Normocephalic and atraumatic.  Eyes: Conjunctivae and EOM are normal. Pupils are equal, round, and reactive to light.  Neck: Normal range of motion. Neck supple. No JVD present. No tracheal deviation present. No thyromegaly present.  Cardiovascular: Normal rate and regular rhythm.   No murmur heard. Pulmonary/Chest: Effort normal and breath sounds normal. She has no wheezes. She exhibits no tenderness.  Abdominal: Soft. Bowel sounds are normal.  Musculoskeletal: Normal range of motion. She exhibits no edema and no tenderness.  Lymphadenopathy:    She has no cervical adenopathy.  Neurological: She is alert and oriented to person, place, and time. She has normal reflexes. No cranial nerve deficit.  Skin: Skin is warm and dry. She is not diaphoretic.  Psychiatric: She has a normal mood and affect. Her behavior is normal.          Assessment &  Plan:   This is a routine wellness  examination for this patient . I reviewed all health maintenance protocols including mammography, colonoscopy, bone density Needed referrals were placed. Age and diagnosis  appropriate screening labs were ordered. Her immunization history was reviewed and appropriate vaccinations were ordered. Her current medications and allergies were reviewed and needed refills of her chronic medications were ordered. The plan for yearly health maintenance was discussed all orders and referrals were made as appropriate. New diagnosis with hypothyroidsm

## 2013-07-14 ENCOUNTER — Ambulatory Visit (INDEPENDENT_AMBULATORY_CARE_PROVIDER_SITE_OTHER): Payer: BC Managed Care – PPO | Admitting: Family Medicine

## 2013-07-14 ENCOUNTER — Encounter: Payer: Self-pay | Admitting: Family Medicine

## 2013-07-14 VITALS — BP 130/84 | HR 70 | Temp 99.2°F | Ht 65.5 in | Wt 197.5 lb

## 2013-07-14 DIAGNOSIS — J069 Acute upper respiratory infection, unspecified: Secondary | ICD-10-CM

## 2013-07-14 DIAGNOSIS — T148 Other injury of unspecified body region: Secondary | ICD-10-CM

## 2013-07-14 DIAGNOSIS — J029 Acute pharyngitis, unspecified: Secondary | ICD-10-CM

## 2013-07-14 DIAGNOSIS — W57XXXA Bitten or stung by nonvenomous insect and other nonvenomous arthropods, initial encounter: Secondary | ICD-10-CM

## 2013-07-14 LAB — POCT RAPID STREP A (OFFICE): RAPID STREP A SCREEN: NEGATIVE

## 2013-07-14 MED ORDER — AMOXICILLIN 875 MG PO TABS: 875.0000 mg | ORAL_TABLET | Freq: Two times a day (BID) | ORAL | Status: DC

## 2013-07-14 NOTE — Addendum Note (Signed)
Addended by: Agnes Lawrence on: 07/14/2013 10:13 AM   Modules accepted: Orders

## 2013-07-14 NOTE — Progress Notes (Addendum)
No chief complaint on file.   HPI:  Tick bite: -10 days ago in [place] -symptoms: for last few days - sore throat, a little diarrhea, low grade temp - nothing over 100 -denies: travel outside of country, recent antibiotics, strep exposure, rash, fevers > 100, CP, SOB, urinary symptoms  ROS: See pertinent positives and negatives per HPI.  Past Medical History  Diagnosis Date  . ALLERGIC RHINITIS 11/07/2006  . ASTHMA 11/07/2006  . COLONIC POLYPS, HX OF 11/07/2006  . DIVERTICULOSIS, COLON 11/07/2006  . Actinic keratosis 01/17/2008    Past Surgical History  Procedure Laterality Date  . Cesarean section      Family History  Problem Relation Age of Onset  . Gout Father   . Arthritis    . Cancer Father     History   Social History  . Marital Status: Married    Spouse Name: N/A    Number of Children: N/A  . Years of Education: N/A   Social History Main Topics  . Smoking status: Never Smoker   . Smokeless tobacco: Never Used  . Alcohol Use: Yes     Comment: wine  . Drug Use: No  . Sexual Activity: Yes   Other Topics Concern  . None   Social History Narrative  . None    Current outpatient prescriptions:beclomethasone (QVAR) 80 MCG/ACT inhaler, Inhale 1 puff into the lungs 2 (two) times daily., Disp: 26.1 Inhaler, Rfl: 3;  Calcium Carbonate-Vitamin D (CALTRATE 600+D) 600-400 MG-UNIT per tablet, Take 1 tablet by mouth 2 (two) times daily.  , Disp: , Rfl: ;  fexofenadine-pseudoephedrine (ALLEGRA-D) 60-120 MG per tablet, Take 1 tablet by mouth 2 (two) times daily., Disp: 60 tablet, Rfl: 11 levothyroxine (SYNTHROID, LEVOTHROID) 75 MCG tablet, Take 1 tablet (75 mcg total) by mouth daily., Disp: 90 tablet, Rfl: 3;  amoxicillin (AMOXIL) 875 MG tablet, Take 1 tablet (875 mg total) by mouth 2 (two) times daily., Disp: 20 tablet, Rfl: 0  EXAM:  Filed Vitals:   07/14/13 0931  BP: 130/84  Pulse: 70  Temp: 99.2 F (37.3 C)    Body mass index is 32.35 kg/(m^2).  GENERAL:  vitals reviewed and listed above, alert, oriented, appears well hydrated and in no acute distress  HEENT: atraumatic, conjunttiva clear, no obvious abnormalities on inspection of external nose and ears, normal appearance of ear canals and TMs except for clear effusion R, clear nasal congestion, mild post oropharyngeal erythema with PND, 1+ tonsillar edema or with drainage or exudate, no sinus TTP  NECK: no obvious masses on inspection  LUNGS: clear to auscultation bilaterally, no wheezes, rales or rhonchi, good air movement  CV: HRRR, no peripheral edema  SKIN: No rash  MS: moves all extremities without noticeable abnormality  PSYCH: pleasant and cooperative, no obvious depression or anxiety  ASSESSMENT AND PLAN:  Discussed the following assessment and plan:  Sore throat - Plan: amoxicillin (AMOXIL) 875 MG tablet  Acute upper respiratory infections of unspecified site  Tick bite - Plan: amoxicillin (AMOXIL) 875 MG tablet  -discussed options and she wants to do amox for tick bite for possible early lyme - though as we discussed symptoms more c/w URI with sore throat and exam findings, likely viral, but amox will also cover for strep -tick born illness and return precuations disussed -Patient advised to return or notify a doctor immediately if symptoms worsen or persist or new concerns arise.  Patient Instructions       Jasmin Small.

## 2013-07-14 NOTE — Progress Notes (Signed)
Pre visit review using our clinic review tool, if applicable. No additional management support is needed unless otherwise documented below in the visit note. 

## 2013-07-15 LAB — CULTURE, GROUP A STREP

## 2013-07-16 ENCOUNTER — Telehealth: Payer: Self-pay | Admitting: Internal Medicine

## 2013-07-16 NOTE — Telephone Encounter (Signed)
Patient informed and stated she does not want to have testing for mono at this time and will call back if needed.

## 2013-07-16 NOTE — Telephone Encounter (Signed)
This is likely a viral illness and is likely not mono but can check monospot test if she wishes.The culture did not show strep throat, but another form of strep that is often a normal bacteria in the body and usually does not cause infection in adults. The amoxicillin is good coverage for most bacterial infection, but could switch to doxy which is a little stronger for sinus infections if she wishes. I think this is viral and antibiotics do not treat viral infections. Most viral infections take about 5-7 days and up to 2-3 weeks to fully resolve. If feeling worse or not improving over the next 3-4 days would advise she see a doctor for re-evaluation.

## 2013-07-16 NOTE — Telephone Encounter (Signed)
Pt states she is not feeling any better, continues to have fever 100 when she doesn't take tylenol.  Sore throat has gone from severe to moderate. Pt is very very tired. Pt has joint issue in knees, aches. Pt now concerned she may have mono?  Pt wanting know if strep culture has come back.  Pt states change of antibiotic may be necessary per dr Maudie Mercury if not strep. Pt would like a cb to know what she should do and w/ culture results.

## 2013-07-17 ENCOUNTER — Encounter: Payer: Self-pay | Admitting: Family Medicine

## 2013-07-17 ENCOUNTER — Telehealth: Payer: Self-pay | Admitting: Internal Medicine

## 2013-07-17 ENCOUNTER — Ambulatory Visit (INDEPENDENT_AMBULATORY_CARE_PROVIDER_SITE_OTHER): Payer: BC Managed Care – PPO | Admitting: Family Medicine

## 2013-07-17 VITALS — BP 124/60 | HR 121 | Temp 102.6°F | Wt 191.0 lb

## 2013-07-17 DIAGNOSIS — A6920 Lyme disease, unspecified: Secondary | ICD-10-CM

## 2013-07-17 MED ORDER — DOXYCYCLINE HYCLATE 100 MG PO TABS: 100.0000 mg | ORAL_TABLET | Freq: Two times a day (BID) | ORAL | Status: DC

## 2013-07-17 NOTE — Telephone Encounter (Signed)
Pt called back requesting appointment, pt is scheduled to see Dr. Yong Channel.  She could not make the 11 sda slot available and needed a later time.

## 2013-07-17 NOTE — Assessment & Plan Note (Addendum)
Febrile to 102 with myalgias, headaches and recent tick exposure in [place]. No rash but 20% of lyme disease is without rash. High risk for lyme disease with high pretest probability. Planned to treat 14-21 days but patient requests 30 day treatment which is typical in [place] so we made a shared decision on 30 day course. Possibly this is non group a strep pharyngitis but odd that sore throat improved with amoxicillin but then fever started 2 days later. Patient with history of lyme disease. Not sure that antibodies will be beneficial but patient would like to have and confirmed that IgG and IgM will be tested by lab. If IgM positive, more likely this is acute lyme but still does not prove. Patient is well appearing today but if fever is persistent or her symptoms worsen I have asked her to return. Could consider blood cultures. Also, have referred patient to ID as requested per Dr. Julianne Rice phone notes for most appropriate testing of lyme disease.

## 2013-07-17 NOTE — Telephone Encounter (Signed)
Refer to id if she wishes for her concerns regarding lyme. If still having fevers should see a doctor to check for other causes of fever. She is on antibiotic.

## 2013-07-17 NOTE — Telephone Encounter (Signed)
Patient scheduled for an appt with Dr Yong Channel today at 1:30pm and Dr Maudie Mercury is aware.

## 2013-07-17 NOTE — Telephone Encounter (Signed)
Pt is calling back requesting to come in and have blood work for lyme disease. Can I sch?

## 2013-07-17 NOTE — Telephone Encounter (Signed)
Pt saw dr Maudie Mercury on 07-14-13. Pt had fever yesterday  102 with tylenol. Pt no longer has sore throat however she still have bodyaches, fatique ?lyme disease. Pt would like to go ahead and be referred to specialist dr kim had mention.

## 2013-07-17 NOTE — Patient Instructions (Addendum)
We did not find a cause outside of possibly a non typical form of strep throat infection as a cause of your fever.  Given your tick contact, and continued fever despite being on amoxicillin, we will change your antibiotic today to doxycycline. You were given 30 days worth. We have referred you to the ID clinic.   If your symptoms worsen or continue through the weekend, I would ask that you return to care at that time.   We did test for antibodies though I am not sure of the benefit given previous infection.   Thanks, Dr. Yong Channel

## 2013-07-17 NOTE — Progress Notes (Signed)
Garret Reddish, MD Phone: [phone]  Subjective:   Jasmin Small is a 58 y.o. year old very pleasant female patient who presents with the following:  Fever/myalgias in context of tick bite in [place] Patient seen on 7/13/15by Dr. Maudie Mercury. Patient thought likely to have viral URI given symptoms of sore throat, low grade fevers, mild diarrhea. Given that patient had a known tick bite in [place] somewhere between 7/4 and 7/8 as well as concern of potential strep throat-patient treated with amoxicillin. With this therapy, sore throat improved. Culture showed non group a beta hemolytic strep. Despite this improvement, patient developed fever to 102 yesterday as well as myalgias and headaches. Patient states she also has had decreased appetite over last 5 days.  ROS-no neck stiffness. No nausea/vomiting. No rash. No chest pain or shortness of breath. No ear pain or throat pain at present.   Past Medical History- patient with history of lyme disease approximately 10 years ago, allergic rhinitis, asthma, diverticulosis, history of colon polyps  Medications- reviewed and updated Current Outpatient Prescriptions  Medication Sig Dispense Refill  . beclomethasone (QVAR) 80 MCG/ACT inhaler Inhale 1 puff into the lungs 2 (two) times daily.  26.1 Inhaler  3  . Calcium Carbonate-Vitamin D (CALTRATE 600+D) 600-400 MG-UNIT per tablet Take 1 tablet by mouth 2 (two) times daily.        Marland Kitchen doxycycline (VIBRA-TABS) 100 MG tablet Take 1 tablet (100 mg total) by mouth 2 (two) times daily.  60 tablet  0  . fexofenadine-pseudoephedrine (ALLEGRA-D) 60-120 MG per tablet Take 1 tablet by mouth 2 (two) times daily.  60 tablet  11  . levothyroxine (SYNTHROID, LEVOTHROID) 75 MCG tablet Take 1 tablet (75 mcg total) by mouth daily.  90 tablet  3   No current facility-administered medications for this visit.    Objective: BP 124/60  Pulse 121  Temp(Src) 102.6 F (39.2 C) (Oral)  Wt 191 lb (86.637 kg) Gen: NAD, resting comfortably  on table. Skin warm to touch.  HEENT: mild erythema of tonsils without exudate, no enlarged lymph nodes Neck supple without stiffiness CV: rate rapid but regular, murmurs rubs or gallops Lungs: CTAB no crackles, wheeze, rhonchi Abdomen: soft/nontender/nondistended/normal bowel sounds. No rebound or guarding.  Ext: no edema Skin: no rash identified on trunk, legs, arms, neck or face Neuro: CN II-XII intact, sensation and reflexes normal throughout, 5/5 muscle strength in bilateral upper and lower extremities. Normal finger to nose. Normal rapid alternating movements. Normal gait.   Assessment/Plan:  Lyme disease Febrile to 102 with myalgias, headaches and recent tick exposure in [place]. No rash but 20% of lyme disease is without rash. High risk for lyme disease with high pretest probability. Planned to treat 14-21 days but patient requests 30 day treatment which is typical in [place] so we made a shared decision on 30 day course. Possibly this is non group a strep pharyngitis but odd that sore throat improved with amoxicillin but then fever started 2 days later. Patient with history of lyme disease. Not sure that antibodies will be beneficial but patient would like to have and confirmed that IgG and IgM will be tested by lab. If IgM positive, more likely this is acute lyme but still does not prove. Patient is well appearing today but if fever is persistent or her symptoms worsen I have asked her to return. Could consider blood cultures. Also, have referred patient to ID as requested per Dr. Julianne Rice phone notes for most appropriate testing of lyme disease.  Orders Placed This Encounter  Procedures  . B. Burgdorfi Antibodies    igg and igm  . Ambulatory referral to Infectious Disease    Referral Priority:  Routine    Referral Type:  Consultation    Referral Reason:  Specialty Services Required    Requested Specialty:  Infectious Diseases    Number of Visits Requested:  1    Meds ordered this  encounter  Medications  . doxycycline (VIBRA-TABS) 100 MG tablet    Sig: Take 1 tablet (100 mg total) by mouth 2 (two) times daily.    Dispense:  60 tablet    Refill:  0

## 2013-07-18 LAB — B. BURGDORFI ANTIBODIES: B burgdorferi Ab IgG+IgM: 0.29 {ISR}

## 2013-11-20 ENCOUNTER — Other Ambulatory Visit: Payer: Self-pay

## 2013-11-20 DIAGNOSIS — Z1231 Encounter for screening mammogram for malignant neoplasm of breast: Secondary | ICD-10-CM

## 2013-11-24 ENCOUNTER — Encounter: Payer: Self-pay | Admitting: Nurse Practitioner

## 2013-11-24 ENCOUNTER — Ambulatory Visit (INDEPENDENT_AMBULATORY_CARE_PROVIDER_SITE_OTHER): Payer: BC Managed Care – PPO | Admitting: Nurse Practitioner

## 2013-11-24 VITALS — BP 142/92 | HR 72 | Ht 65.75 in | Wt 193.0 lb

## 2013-11-24 DIAGNOSIS — Z01419 Encounter for gynecological examination (general) (routine) without abnormal findings: Secondary | ICD-10-CM

## 2013-11-24 DIAGNOSIS — Z1211 Encounter for screening for malignant neoplasm of colon: Secondary | ICD-10-CM

## 2013-11-24 DIAGNOSIS — Z0000 Encounter for general adult medical examination without abnormal findings: Secondary | ICD-10-CM

## 2013-11-24 LAB — POCT URINALYSIS DIPSTICK
Bilirubin, UA: NEGATIVE
Glucose, UA: NEGATIVE
KETONES UA: NEGATIVE
Leukocytes, UA: NEGATIVE
Nitrite, UA: NEGATIVE
PH UA: 5
PROTEIN UA: NEGATIVE
RBC UA: NEGATIVE
Urobilinogen, UA: NEGATIVE

## 2013-11-24 MED ORDER — ESTRADIOL 10 MCG VA TABS: ORAL_TABLET | VAGINAL | Status: DC

## 2013-11-24 MED ORDER — AZITHROMYCIN 250 MG PO TABS: 250.0000 mg | ORAL_TABLET | Freq: Every day | ORAL | Status: DC

## 2013-11-24 NOTE — Progress Notes (Signed)
58 y.o. G62P2020 Married Caucasian Fe here for annual exam.  Recent treatment for Lyme disease.  Currently with URI symptoms and taking OTC cough and cold med's X 2 weeks.  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  She request that we treat her today for URI.  She also states the Estring was uncomfortable for her to use and desires to try Vagifem.  Still having a lot of vaginal dryness with dyspareunia.   Patient's last menstrual period was 01/03/2007 (approximate).          Sexually active: Yes.    The current method of family planning is post menopausal status.    Exercising: Yes.    Home exercise routine includes walking about 3-4 times a week. Smoker:  no  Health Maintenance: Pap:  11/09/11 neg HR HPV MMG:  12/18/11 Bi-Rads Neg, Next scheduled for 12/12/13 Colonoscopy:  2006 WNL, repeat in 10 years - IFOB BMD:   2011, repeat in 5 years TDaP:  01/19/2012 Labs: PCP in 04/2013,     UA: neg, ph: 5.0   reports that she has never smoked. She has never used smokeless tobacco. She reports that she drinks alcohol. She reports that she does not use illicit drugs.  Past Medical History  Diagnosis Date  . ALLERGIC RHINITIS 11/07/2006  . ASTHMA 11/07/2006  . COLONIC POLYPS, HX OF 11/07/2006  . DIVERTICULOSIS, COLON 11/07/2006  . Actinic keratosis 01/17/2008    Past Surgical History  Procedure Laterality Date  . Cesarean section      Current Outpatient Prescriptions  Medication Sig Dispense Refill  . acyclovir ointment (ZOVIRAX) 5 % Apply 1 application topically every 3 (three) hours.    . beclomethasone (QVAR) 80 MCG/ACT inhaler Inhale 1 puff into the lungs 2 (two) times daily. 26.1 Inhaler 3  . Calcium Carbonate-Vitamin D (CALTRATE 600+D) 600-400 MG-UNIT per tablet Take 1 tablet by mouth 2 (two) times daily.      . fexofenadine-pseudoephedrine (ALLEGRA-D) 60-120 MG per tablet Take 1 tablet by mouth 2 (two) times daily. 60 tablet 11  .  levothyroxine (SYNTHROID, LEVOTHROID) 75 MCG tablet Take 1 tablet (75 mcg total) by mouth daily. 90 tablet 3  . azithromycin (ZITHROMAX) 250 MG tablet Take 1 tablet (250 mg total) by mouth daily. 6 tablet 0   No current facility-administered medications for this visit.    Family History  Problem Relation Age of Onset  . Gout Father   . Arthritis    . Cancer Father     ROS:  Pertinent items are noted in HPI.  Otherwise, a comprehensive ROS was negative.  Exam:   BP 142/92 mmHg  Pulse 72  Ht 5' 5.75" (1.67 m)  Wt 193 lb (87.544 kg)  BMI 31.39 kg/m2  LMP 01/03/2007 (Approximate) Height: 5' 5.75" (167 cm)  Ht Readings from Last 3 Encounters:  11/24/13 5' 5.75" (1.67 m)  07/14/13 5' 5.5" (1.664 m)  04/21/13 5' 5.5" (1.664 m)    General appearance: alert, cooperative and appears stated age Head: Normocephalic, without obvious abnormality, atraumatic Neck: no adenopathy, supple, symmetrical, trachea midline and thyroid normal to inspection and palpation Lungs: congested cough to auscultation bilaterally, no wheezing Breasts: normal appearance, no masses or tenderness Heart: regular rate and rhythm Abdomen: soft, non-tender; no masses,  no organomegaly Extremities: extremities normal, atraumatic, no cyanosis or edema Skin: Skin color, texture, turgor normal. No rashes or lesions Lymph nodes: Cervical, supraclavicular, and axillary nodes normal.  No abnormal inguinal nodes palpated Neurologic: Grossly normal   Pelvic: External genitalia:  no lesions              Urethra:  normal appearing urethra with no masses, tenderness or lesions              Bartholin's and Skene's: normal                 Vagina: normal appearing vagina with normal color and discharge, no lesions              Cervix: anteverted and retroverted              Pap taken: No. Bimanual Exam:  Uterus:  normal size, contour, position, consistency, mobility, non-tender              Adnexa: no mass, fullness,  tenderness               Rectovaginal: Confirms               Anus:  normal sphincter tone, no lesions  A:  Well Woman with normal exam  Postmenopausal no HRT  Atrophic vaginitis - did not like Estring  URI   P:   Reviewed health and wellness pertinent to exam  Pap smear not taken today  Mammogram is  scheduled for 12/12/13  RX for Vagifem HS X 14 then twice a week for a year  Counseled on risk of CVA, DVT, cancer, etc.  IFOB is given today  RX for Zithromax Z pack use as directed  - if no better or symptoms worsens will seek care at Urgent Care  Counseled on breast self exam, mammography screening, use and side effects of HRT, adequate intake of calcium and vitamin D, diet and exercise, Kegel's exercises return annually or prn  An After Visit Summary was printed and given to the patient.

## 2013-11-24 NOTE — Patient Instructions (Addendum)
EXERCISE AND DIET:  We recommended that you start or continue a regular exercise program for good health. Regular exercise means any activity that makes your heart beat faster and makes you sweat.  We recommend exercising at least 30 minutes per day at least 3 days a week, preferably 4 or 5.  We also recommend a diet low in fat and sugar.  Inactivity, poor dietary choices and obesity can cause diabetes, heart attack, stroke, and kidney damage, among others.    ALCOHOL AND SMOKING:  Women should limit their alcohol intake to no more than 7 drinks/beers/glasses of wine (combined, not each!) per week. Moderation of alcohol intake to this level decreases your risk of breast cancer and liver damage. And of course, no recreational drugs are part of a healthy lifestyle.  And absolutely no smoking or even second hand smoke. Most people know smoking can cause heart and lung diseases, but did you know it also contributes to weakening of your bones? Aging of your skin?  Yellowing of your teeth and nails?  CALCIUM AND VITAMIN D:  Adequate intake of calcium and Vitamin D are recommended.  The recommendations for exact amounts of these supplements seem to change often, but generally speaking 600 mg of calcium (either carbonate or citrate) and 800 units of Vitamin D per day seems prudent. Certain women may benefit from higher intake of Vitamin D.  If you are among these women, your doctor will have told you during your visit.    PAP SMEARS:  Pap smears, to check for cervical cancer or precancers,  have traditionally been done yearly, although recent scientific advances have shown that most women can have pap smears less often.  However, every woman still should have a physical exam from her gynecologist every year. It will include a breast check, inspection of the vulva and vagina to check for abnormal growths or skin changes, a visual exam of the cervix, and then an exam to evaluate the size and shape of the uterus and  ovaries.  And after 59 years of age, a rectal exam is indicated to check for rectal cancers. We will also provide age appropriate advice regarding health maintenance, like when you should have certain vaccines, screening for sexually transmitted diseases, bone density testing, colonoscopy, mammograms, etc.   MAMMOGRAMS:  All women over 40 years old should have a yearly mammogram. Many facilities now offer a "3D" mammogram, which may cost around $50 extra out of pocket. If possible,  we recommend you accept the option to have the 3D mammogram performed.  It both reduces the number of women who will be called back for extra views which then turn out to be normal, and it is better than the routine mammogram at detecting truly abnormal areas.    COLONOSCOPY:  Colonoscopy to screen for colon cancer is recommended for all women at age 50.  We know, you hate the idea of the prep.  We agree, BUT, having colon cancer and not knowing it is worse!!  Colon cancer so often starts as a polyp that can be seen and removed at colonscopy, which can quite literally save your life!  And if your first colonoscopy is normal and you have no family history of colon cancer, most women don't have to have it again for 10 years.  Once every ten years, you can do something that may end up saving your life, right?  We will be happy to help you get it scheduled when you are ready.    Be sure to check your insurance coverage so you understand how much it will cost.  It may be covered as a preventative service at no cost, but you should check your particular policy.     Atrophic Vaginitis Atrophic vaginitis is a problem of low levels of estrogen in women. This problem can happen at any age. It is most common in women who have gone through menopause ("the change").  HOW WILL I KNOW IF I HAVE THIS PROBLEM? You may have:  Trouble with peeing (urinating), such as:  Going to the bathroom often.  A hard time holding your pee until you reach  a bathroom.  Leaking pee.  Having pain when you pee.  Itching or a burning feeling.  Vaginal bleeding and spotting.  Pain during sex.  Dryness of the vagina.  A yellow, bad-smelling fluid (discharge) coming from the vagina. HOW WILL MY DOCTOR CHECK FOR THIS PROBLEM?  During your exam, your doctor will likely find the problem.  If there is a vaginal fluid, it may be checked for infection. HOW WILL THIS PROBLEM BE TREATED? Keep the vulvar skin as clean as possible. Moisturizers and lubricants can help with some of the symptoms. Estrogen replacement can help. There are 2 ways to take estrogen:  Systemic estrogen gets estrogen to your whole body. It takes many weeks or months before the symptoms get better.  You take an estrogen pill.  You use a skin patch. This is a patch that you put on your skin.  If you still have your uterus, your doctor may ask you to take a hormone. Talk to your doctor about the right medicine for you.  Estrogen cream.  This puts estrogen only at the part of your body where you apply it. The cream is put into the vagina or put on the vulvar skin. For some women, estrogen cream works faster than pills or the patch. CAN ALL WOMEN WITH THIS PROBLEM USE ESTROGEN? No. Women with certain types of cancer, liver problems, or problems with blood clots should not take estrogen. Your doctor can help you decide the best treatment for your symptoms. Document Released: 06/07/2007 Document Revised: 12/24/2012 Document Reviewed: 06/07/2007 ExitCare Patient Information 2015 [employer]. This information is not intended to replace advice given to you by your health care provider. Make sure you discuss any questions you have with your health care provider.  

## 2013-11-26 NOTE — Progress Notes (Signed)
Reviewed personally.  M. Suzanne Emmaleigh Longo, MD.  

## 2013-12-12 ENCOUNTER — Ambulatory Visit
Admission: RE | Admit: 2013-12-12 | Discharge: 2013-12-12 | Disposition: A | Discharge status: Home / Self Care | Payer: BC Managed Care – PPO | Source: Ambulatory Visit

## 2013-12-12 DIAGNOSIS — Z1231 Encounter for screening mammogram for malignant neoplasm of breast: Secondary | ICD-10-CM

## 2014-01-13 ENCOUNTER — Ambulatory Visit (INDEPENDENT_AMBULATORY_CARE_PROVIDER_SITE_OTHER): Payer: BLUE CROSS/BLUE SHIELD | Admitting: Family Medicine

## 2014-01-13 ENCOUNTER — Encounter: Payer: Self-pay | Admitting: Family Medicine

## 2014-01-13 VITALS — BP 124/80 | HR 101 | Temp 98.0°F | Ht 65.75 in | Wt 196.1 lb

## 2014-01-13 DIAGNOSIS — E039 Hypothyroidism, unspecified: Secondary | ICD-10-CM

## 2014-01-13 DIAGNOSIS — M25562 Pain in left knee: Secondary | ICD-10-CM

## 2014-01-13 DIAGNOSIS — M25552 Pain in left hip: Secondary | ICD-10-CM

## 2014-01-13 LAB — TSH: TSH: 1.39 u[IU]/mL (ref 0.35–4.50)

## 2014-01-13 NOTE — Patient Instructions (Signed)
BEFORE YOU LEAVE: IT band and low back exercises Labs  -We have ordered labs or studies at this visit. It can take up to 1-2 weeks for results and processing. We will contact you with instructions IF your results are abnormal. Normal results will be released to your [facility]. If you have not heard from [place] or can not find your results in [facility] in 2 weeks please contact our office.  -do the exercises provided about 4 days per week  -follow up as scheduled or sooner if needed

## 2014-01-13 NOTE — Progress Notes (Signed)
HPI:  Jasmin Small is a prior patient of Dr. Arnoldo Morale with PMH sig for hypothyroidism, allergic rhinitis and asthma whom currently does not have a PCP here for an acute visit for:  Hypothyroidism: -last TSH a little elevated 9 mo ago and reports Dr. Arnoldo Morale started her on a thyroid medication -current dose synthroid: on synthroid 18mcg since with no thyroid checks -some stiffness in L hip and knee for > 6 months - mild and notices after setting and gets up, not worsening, goes away with activity and aleve -walks a lot - not as much when old -denies: heat/cold intolerance, skin changes, weight changes, fevers, malaise, weight loss, swelling or erythema of joints    ROS: See pertinent positives and negatives per HPI.  Past Medical History  Diagnosis Date  . ALLERGIC RHINITIS 11/07/2006  . ASTHMA 11/07/2006  . COLONIC POLYPS, HX OF 11/07/2006  . DIVERTICULOSIS, COLON 11/07/2006  . Actinic keratosis 01/17/2008    Past Surgical History  Procedure Laterality Date  . Cesarean section      Family History  Problem Relation Age of Onset  . Gout Father   . Arthritis    . Cancer Father     History   Social History  . Marital Status: Married    Spouse Name: N/A    Number of Children: N/A  . Years of Education: N/A   Social History Main Topics  . Smoking status: Never Smoker   . Smokeless tobacco: Never Used  . Alcohol Use: Yes     Comment: wine  . Drug Use: No  . Sexual Activity: Yes   Other Topics Concern  . None   Social History Narrative    Current outpatient prescriptions: acyclovir ointment (ZOVIRAX) 5 %, Apply 1 application topically every 3 (three) hours., Disp: , Rfl: ;  beclomethasone (QVAR) 80 MCG/ACT inhaler, Inhale 1 puff into the lungs 2 (two) times daily., Disp: 26.1 Inhaler, Rfl: 3;  Calcium Carbonate-Vitamin D (CALTRATE 600+D) 600-400 MG-UNIT per tablet, Take 1 tablet by mouth 2 (two) times daily.  , Disp: , Rfl:  Estradiol 10 MCG TABS vaginal tablet, Use at  HS X 14 then twice a week, Disp: 18 tablet, Rfl: 12;  fexofenadine-pseudoephedrine (ALLEGRA-D) 60-120 MG per tablet, Take 1 tablet by mouth 2 (two) times daily., Disp: 60 tablet, Rfl: 11;  levothyroxine (SYNTHROID, LEVOTHROID) 75 MCG tablet, Take 1 tablet (75 mcg total) by mouth daily., Disp: 90 tablet, Rfl: 3;  naproxen sodium (ANAPROX) 220 MG tablet, Take 220 mg by mouth as needed., Disp: , Rfl:   EXAM:  Filed Vitals:   01/13/14 1304  BP: 124/80  Pulse: 101  Temp: 98 F (36.7 C)    Body mass index is 31.89 kg/(m^2).  GENERAL: vitals reviewed and listed above, alert, oriented, appears well hydrated and in no acute distress  HEENT: atraumatic, conjunttiva clear, no obvious abnormalities on inspection of external nose and ears  NECK: no obvious masses on inspection  LUNGS: clear to auscultation bilaterally, no wheezes, rales or rhonchi, good air movement  CV: HRRR, no peripheral edema  MS: moves all extremities without noticeable abnormality Normal gait Normal inspection on knees, no effusion TTP lat knee and IT band Neg lachman, neg ant/post drawer, neg mcmurry, neg pat crepitus, neg j sign Normal ROM hip with no pain with int/ext rotation or with compression NV intact bilat LEs  PSYCH: pleasant and cooperative, no obvious depression or anxiety  ASSESSMENT AND PLAN:  Discussed the following assessment and plan:  Hypothyroidism, unspecified hypothyroidism type - Plan: TSH  Left knee pain Left hip pain -we discussed possible serious and likely etiologies, workup and treatment, treatment risks and return precautions -query IT band syndrome versus OA most likely, less likely related to systemic arthralgic condition given location and findings on exam -offered plain films -she opted to start with HEP and follow up if persists or worsens over next 1 month  -refused flu vaccine -Patient advised to return or notify a doctor immediately if symptoms worsen or persist or new  concerns arise.  Patient Instructions  BEFORE YOU LEAVE: IT band and low back exercises Labs  -We have ordered labs or studies at this visit. It can take up to 1-2 weeks for results and processing. We will contact you with instructions IF your results are abnormal. Normal results will be released to your [facility]. If you have not heard from [place] or can not find your results in [facility] in 2 weeks please contact our office.  -do the exercises provided about 4 days per week  -follow up as scheduled or sooner if needed          KIM, Jarrett Soho R.

## 2014-01-13 NOTE — Progress Notes (Signed)
Pre visit review using our clinic review tool, if applicable. No additional management support is needed unless otherwise documented below in the visit note. 

## 2014-02-16 ENCOUNTER — Telehealth: Payer: Self-pay | Admitting: Internal Medicine

## 2014-02-16 MED ORDER — BECLOMETHASONE DIPROPIONATE 80 MCG/ACT IN AERS: 1.0000 | INHALATION_SPRAY | Freq: Two times a day (BID) | RESPIRATORY_TRACT | Status: DC

## 2014-02-16 NOTE — Telephone Encounter (Signed)
Ok to refill x 3 months??       

## 2014-02-16 NOTE — Telephone Encounter (Signed)
Pt request refill of the following: beclomethasone (QVAR) 80 MCG/ACT inhaler  Pt is requesting a refill on the above medicine         Phamacy:  [facility]

## 2014-02-16 NOTE — Telephone Encounter (Signed)
Rx done. 

## 2014-03-09 ENCOUNTER — Encounter: Payer: Self-pay | Admitting: Family Medicine

## 2014-03-09 ENCOUNTER — Ambulatory Visit (INDEPENDENT_AMBULATORY_CARE_PROVIDER_SITE_OTHER): Payer: BLUE CROSS/BLUE SHIELD | Admitting: Family Medicine

## 2014-03-09 ENCOUNTER — Ambulatory Visit (INDEPENDENT_AMBULATORY_CARE_PROVIDER_SITE_OTHER)
Admission: RE | Admit: 2014-03-09 | Discharge: 2014-03-09 | Disposition: A | Discharge status: Home / Self Care | Payer: BLUE CROSS/BLUE SHIELD | Source: Ambulatory Visit | Attending: Family Medicine | Admitting: Family Medicine

## 2014-03-09 VITALS — BP 130/78 | HR 86 | Temp 100.4°F | Wt 195.0 lb

## 2014-03-09 DIAGNOSIS — R509 Fever, unspecified: Secondary | ICD-10-CM

## 2014-03-09 DIAGNOSIS — R059 Cough, unspecified: Secondary | ICD-10-CM

## 2014-03-09 DIAGNOSIS — R05 Cough: Secondary | ICD-10-CM

## 2014-03-09 MED ORDER — LEVOFLOXACIN 500 MG PO TABS: 500.0000 mg | ORAL_TABLET | Freq: Every day | ORAL | Status: DC

## 2014-03-09 NOTE — Progress Notes (Addendum)
   Subjective:    Patient ID: Jasmin Small, female    DOB: [birth year], 59 y.o.   MRN: [id number]  HPI Acute visit. Patient developed onset last Thursday a fever and has had fever up to 101 intermittently since then. She's had some chills and headache. Body aches. Minimal nasal symptoms. She's had increased fatigue. Mild nausea without vomiting. No skin rash. Did not have flu vaccine. Travels frequently for work. She has underlying asthma. She takes Qvar regularly. Nonsmoker.  Past Medical History  Diagnosis Date  . ALLERGIC RHINITIS 11/07/2006  . ASTHMA 11/07/2006  . COLONIC POLYPS, HX OF 11/07/2006  . DIVERTICULOSIS, COLON 11/07/2006  . Actinic keratosis 01/17/2008   Past Surgical History  Procedure Laterality Date  . Cesarean section      reports that she has never smoked. She has never used smokeless tobacco. She reports that she drinks alcohol. She reports that she does not use illicit drugs. family history includes Arthritis in an other family member; Cancer in her father; Gout in her father. No Known Allergies    Review of Systems  Constitutional: Positive for fever, chills and fatigue.  Respiratory: Positive for cough. Negative for shortness of breath.   Gastrointestinal: Positive for nausea. Negative for vomiting.  Skin: Negative for rash.  Neurological: Positive for headaches.       Objective:   Physical Exam  Constitutional: She appears well-developed and well-nourished.  HENT:  Right Ear: External ear normal.  Left Ear: External ear normal.  Mouth/Throat: Oropharynx is clear and moist.  Neck: Neck supple.  Cardiovascular: Normal rate and regular rhythm.   Pulmonary/Chest: Effort normal and breath sounds normal. No respiratory distress. She has no wheezes. She has no rales.  Lymphadenopathy:    She has no cervical adenopathy.          Assessment & Plan:  Cough and fever. Nonfocal exam. This may all be viral but with persistence of fever going into day 5 will  obtain chest x-ray to further evaluate. We did not note any localized rales. If normal, treat symptomatically. We did not screen for influenza since she is past window to treat medically  CXR showed possible lingular vs bilateral infiltrate.  Left message on pt's cell phone.  Levaquin 500 mg po qd for 10 days.  Will recommend follow up with primary in one week to reassess.

## 2014-03-09 NOTE — Addendum Note (Signed)
Addended by: Eulas Post on: 03/09/2014 05:47 PM   Modules accepted: Orders

## 2014-03-09 NOTE — Progress Notes (Signed)
Pre visit review using our clinic review tool, if applicable. No additional management support is needed unless otherwise documented below in the visit note. 

## 2014-03-10 ENCOUNTER — Telehealth: Payer: Self-pay | Admitting: *Deleted

## 2014-03-10 NOTE — Telephone Encounter (Signed)
PLEASE NOTE: All timestamps contained within this report are represented as [place] Standard Time. CONFIDENTIALTY NOTICE: This fax transmission is intended only for the addressee. It contains information that is legally privileged, confidential or otherwise protected from use or disclosure. If you are not the intended recipient, you are strictly prohibited from reviewing, disclosing, copying using or disseminating any of this information or taking any action in reliance on or regarding this information. If you have received this fax in error, please notify us immediately by telephone so that we can arrange for its return to [place]. Phone: [phone], Toll-Free: [phone], Fax: [phone] Page: 1 of 1 Call Id: [id number] [facility] - Client [place] Patient Name: Jasmin Small Gender: Female DOB: [birth year] Age: 59 Y 17 M 14 D Return Phone Number: [phone] (Primary) Address: City/State/Zip: [place] Client [facility] - Client Client Site [facility] - Night Physician Carolann Littler Contact Type Call Call Type Triage / Clinical Relationship To Patient Self Return Phone Number [phone] (Primary) Chief Complaint Prescription Refill or Medication Request (non symptomatic) Initial Comment Caller states she saw dr and the pt was sent to get a chest Xray. The caller had a message on her phone indicating that she needed an antibiotic. Caller wanted to page on call about the antibiotic need. Nurse Assessment Nurse: Thad Ranger RN, Denise Date/Time (Eastern Time): 03/09/2014 7:55:28 PM Confirm and document reason for call. If symptomatic, describe symptoms. ---Caller states she saw dr and the pt was sent to get a chest Xray. The caller had a message on her phone indicating that she needed an antibiotic. Caller wanted to page on call about the antibiotic need. Pt states she  called the pharm and got the abx needed. No longer needs asst. Has the patient traveled out of the country within the last 30 days? ---Not Applicable Does the patient require triage? ---No Please document clinical information provided and list any resource used. ---Advised to cb if further services needed. Verb understanding. Guidelines Guideline Title Affirmed Question Affirmed Notes Nurse Date/Time (Eastern Time) Disp. Time Eilene Ghazi Time) Disposition Final User 03/09/2014 7:26:28 PM Attempt made - message left Carmon, RN, Langley Gauss 03/09/2014 7:28:26 PM Attempt made - message left Carmon, RN, Langley Gauss 03/09/2014 7:56:31 PM Clinical Call Yes Thad Ranger, RN, Langley Gauss After Care Instructions Given Call Event Type User Date / Time Description

## 2014-03-10 NOTE — Telephone Encounter (Signed)
Left message on Vm that per Dr. B patient is to start Levaquin and it was sent to pharmacy

## 2014-04-02 ENCOUNTER — Other Ambulatory Visit: Payer: Self-pay | Admitting: Dermatology

## 2014-04-06 ENCOUNTER — Telehealth: Payer: Self-pay | Admitting: Family Medicine

## 2014-04-06 NOTE — Telephone Encounter (Signed)
Patient would like a re-fill on levothyroxine (SYNTHROID, LEVOTHROID) 75 MCG tablet [id number] - [place], [place]  - [address] AT [place]

## 2014-04-07 MED ORDER — LEVOTHYROXINE SODIUM 75 MCG PO TABS: 75.0000 ug | ORAL_TABLET | Freq: Every day | ORAL | Status: DC

## 2014-04-23 ENCOUNTER — Encounter: Payer: BC Managed Care – PPO | Admitting: Family Medicine

## 2014-05-01 ENCOUNTER — Ambulatory Visit (INDEPENDENT_AMBULATORY_CARE_PROVIDER_SITE_OTHER): Payer: BLUE CROSS/BLUE SHIELD | Admitting: Family Medicine

## 2014-05-01 ENCOUNTER — Encounter: Payer: Self-pay | Admitting: Family Medicine

## 2014-05-01 VITALS — BP 124/82 | HR 83 | Temp 98.1°F | Ht 66.0 in | Wt 194.7 lb

## 2014-05-01 DIAGNOSIS — R059 Cough, unspecified: Secondary | ICD-10-CM

## 2014-05-01 DIAGNOSIS — Z8701 Personal history of pneumonia (recurrent): Secondary | ICD-10-CM | POA: Diagnosis not present

## 2014-05-01 DIAGNOSIS — J329 Chronic sinusitis, unspecified: Secondary | ICD-10-CM | POA: Diagnosis not present

## 2014-05-01 DIAGNOSIS — E039 Hypothyroidism, unspecified: Secondary | ICD-10-CM | POA: Diagnosis not present

## 2014-05-01 DIAGNOSIS — Z7189 Other specified counseling: Secondary | ICD-10-CM

## 2014-05-01 DIAGNOSIS — Z6831 Body mass index (BMI) 31.0-31.9, adult: Secondary | ICD-10-CM

## 2014-05-01 DIAGNOSIS — R05 Cough: Secondary | ICD-10-CM

## 2014-05-01 DIAGNOSIS — Z7689 Persons encountering health services in other specified circumstances: Secondary | ICD-10-CM

## 2014-05-01 NOTE — Progress Notes (Signed)
Pre visit review using our clinic review tool, if applicable. No additional management support is needed unless otherwise documented below in the visit note. 

## 2014-05-01 NOTE — Patient Instructions (Addendum)
BEFORE YOU LEAVE: - CPE in 6-12 months - xray  -We placed a referral for you as discussed for the xray.   We recommend the following healthy lifestyle measures: - eat a healthy diet consisting of lots of vegetables, fruits, beans, nuts, seeds, healthy meats such as white chicken and fish and whole grains.  - avoid sweets and starchy foods,  fried foods, fast food, processed foods, sodas, red meet and other fattening foods.  - get a least 150 minutes of aerobic exercise per week.

## 2014-05-01 NOTE — Progress Notes (Signed)
HPI:  Jasmin Small is here to establish care. She used to see Dr. Arnoldo Morale. She sees [facility]s for her annual women's exams.   Has the following chronic problems that require follow up and concerns today:  Hypothyroidism: -dx in 2015 with prior PCP -meds: levothyroxine 75 mcg -report: doing well -denies: hot/cold intolerance, palpitations, skin changes, no unintentional weight changes  Asthma and allergies: -see quality metrics, dx with asthma in 2007 at [facility] -meds: qvar once daily, allegra -D -she has had worsening nasal congestion, cough, pnd -denies: SOB, wheezing, fevers, facial pain, ear pain -has been around folks with the "cold" -hx ? pna 03/09/14 - told to get repeat CXR - symptoms completely resolved after tx  Obesity: -walks some -diet is ok but carbs are her weakness  ROS negative for unless reported above: fevers, unintentional weight loss, hearing or vision loss, chest pain, palpitations, struggling to breath, hemoptysis, melena, hematochezia, hematuria, falls, loc, si, thoughts of self harm  Past Medical History  Diagnosis Date  . ALLERGIC RHINITIS 11/07/2006  . ASTHMA 11/07/2006  . COLONIC POLYPS, HX OF 11/07/2006  . DIVERTICULOSIS, COLON 11/07/2006  . Actinic keratosis 01/17/2008    Past Surgical History  Procedure Laterality Date  . Cesarean section      Family History  Problem Relation Age of Onset  . Gout Father   . Arthritis    . Cancer Father     bladder    History   Social History  . Marital Status: Married    Spouse Name: N/A  . Number of Children: N/A  . Years of Education: N/A   Social History Main Topics  . Smoking status: Never Smoker   . Smokeless tobacco: Never Used  . Alcohol Use: Yes     Comment: wine  . Drug Use: No  . Sexual Activity: Yes   Other Topics Concern  . 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  acyclovir ointment (ZOVIRAX) 5 %, Apply 1 application topically every 3 (three) hours., Disp: , Rfl:  .  beclomethasone (QVAR) 80 MCG/ACT inhaler, Inhale 1 puff into the lungs 2 (two) times daily., Disp: 3 Inhaler, Rfl: 0 .  Calcium Carbonate-Vitamin D (CALTRATE 600+D) 600-400 MG-UNIT per tablet, Take 1 tablet by mouth 2 (two) times daily.  , Disp: , Rfl:  .  fexofenadine-pseudoephedrine (ALLEGRA-D) 60-120 MG per tablet, Take 1 tablet by mouth 2 (two) times daily., Disp: 60 tablet, Rfl: 11 .  levothyroxine (SYNTHROID, LEVOTHROID) 75 MCG tablet, Take 1 tablet (75 mcg total) by mouth daily., Disp: 90 tablet, Rfl: 3 .  naproxen sodium (ANAPROX) 220 MG tablet, Take 220 mg by mouth as needed., Disp: , Rfl:   EXAM:  Filed Vitals:   05/01/14 0930  BP: 124/82  Pulse: 83  Temp: 98.1 F (36.7 C)    Body mass index is 31.44 kg/(m^2).  GENERAL: vitals reviewed and listed above, alert, oriented, appears well hydrated and in no acute distress  HEENT: atraumatic, conjunttiva clear, no obvious abnormalities on inspection of external nose and ears, normal appearance of ear canals and TMs, clear  nasal congestion, mild post oropharyngeal erythema with PND, no tonsillar edema or exudate, no sinus TTP  NECK: no obvious masses on inspection  LUNGS: clear to auscultation bilaterally, no wheezes, rales or rhonchi, good air movement  CV: HRRR, no peripheral edema  MS: moves all extremities without noticeable abnormality  PSYCH: pleasant and cooperative, no obvious depression or anxiety  ASSESSMENT AND PLAN:  Discussed the following assessment and plan:  Rhinosinusitis Cough Hx of bacterial pneumonia - Plan: DG Chest 2 View -likely viral, supportive care advised, return precautions -cxr to  ensure pna clearance  Hypothyroidism, unspecified hypothyroidism type - Plan: TSH -continue synthroid and adjust as needed  Establish care -We reviewed the PMH, PSH, FH, SH, Meds and Allergies. -We provided refills for any medications we will prescribe as needed. -We addressed current concerns per orders and patient instructions. -We have asked for records for pertinent exams, studies, vaccines and notes from previous providers. -We have advised patient to follow up per instructions below.  Obesity: healthy lifestyle advised   -Patient advised to return or notify a doctor immediately if symptoms worsen or persist or new concerns arise.  Patient Instructions  BEFORE YOU LEAVE: - CPE in 6-12 months - xray  -We placed a referral for you as discussed for the xray.   We recommend the following healthy lifestyle measures: - eat a healthy diet consisting of lots of vegetables, fruits, beans, nuts, seeds, healthy meats such as white chicken and fish and whole grains.  - avoid sweets and starchy foods,  fried foods, fast food, processed foods, sodas, red meet and other fattening foods.  - get a least 150 minutes of aerobic exercise per week.          Colin Benton R.

## 2014-06-15 ENCOUNTER — Telehealth: Payer: Self-pay

## 2014-06-15 DIAGNOSIS — Z889 Allergy status to unspecified drugs, medicaments and biological substances status: Secondary | ICD-10-CM

## 2014-06-15 MED ORDER — FEXOFENADINE-PSEUDOEPHED ER 60-120 MG PO TB12: 1.0000 | ORAL_TABLET | Freq: Once | ORAL | Status: DC

## 2014-06-15 NOTE — Telephone Encounter (Signed)
[id number] - [place], [place] - [address] AT [place]: fexofenadine-pseudoephedrine (ALLEGRA-D) 60-120 MG per tablet

## 2014-08-20 ENCOUNTER — Telehealth: Payer: Self-pay | Admitting: Family Medicine

## 2014-08-20 MED ORDER — BECLOMETHASONE DIPROPIONATE 80 MCG/ACT IN AERS: 1.0000 | INHALATION_SPRAY | Freq: Two times a day (BID) | RESPIRATORY_TRACT | Status: DC

## 2014-08-20 NOTE — Telephone Encounter (Signed)
Rx done. 

## 2014-08-20 NOTE — Telephone Encounter (Signed)
Pt needs new rx qvar 80 mcg inhale oty 1 call into [facility]

## 2014-10-01 ENCOUNTER — Telehealth: Payer: Self-pay | Admitting: Family Medicine

## 2014-10-01 NOTE — Telephone Encounter (Signed)
Pt call to ask if Dr Maudie Mercury will give her a rx for Ambien for over seas travel    [place]

## 2014-10-01 NOTE — Telephone Encounter (Signed)
I do not advise nor prescribe this medication first line for sleep disorders due to significant side effects and risks. She could try over the counter options. Please offer appt to discus other treatment options for sleep if she feels is needed.

## 2014-10-02 ENCOUNTER — Telehealth: Payer: Self-pay | Admitting: Family Medicine

## 2014-10-02 MED ORDER — ZOLPIDEM TARTRATE 5 MG PO TABS: 5.0000 mg | ORAL_TABLET | Freq: Every evening | ORAL | Status: DC | PRN

## 2014-10-02 NOTE — Telephone Encounter (Signed)
I have never prescribed this for her in the past. It is a controlled medication with significant side effects and risks. Since it appears her prior PCP prescribed this and she feels comfortable taking, will rx 6 per her request. Advise she discus risks with pharmacist. Will not plan to refill in the future without an office visit.

## 2014-10-02 NOTE — Telephone Encounter (Signed)
Printed Rx was faxed to [facility] and I left a message at the pts cell number to return my call-home number busy.

## 2014-10-02 NOTE — Telephone Encounter (Signed)
I called the pt and informed her of the message below.  She stated to tell Dr Maudie Mercury she has used this for years and it is ridiculous and she does not need to come in for an appt.

## 2014-10-02 NOTE — Telephone Encounter (Signed)
To my recollection I have never prescribed this for her and it is not on her medication list.

## 2014-10-02 NOTE — Telephone Encounter (Signed)
Pt called back to say that she was only requesting this medicine AMBIEN  for jet lag to go to sleep on a long flight. She said she only need about 6 pills   Pharmacy; [place]

## 2014-10-22 ENCOUNTER — Other Ambulatory Visit (INDEPENDENT_AMBULATORY_CARE_PROVIDER_SITE_OTHER): Payer: BLUE CROSS/BLUE SHIELD

## 2014-10-22 ENCOUNTER — Other Ambulatory Visit: Payer: Self-pay | Admitting: *Deleted

## 2014-10-22 DIAGNOSIS — Z0000 Encounter for general adult medical examination without abnormal findings: Secondary | ICD-10-CM

## 2014-10-22 LAB — HEMOGLOBIN A1C: Hgb A1c MFr Bld: 5.4 % (ref 4.6–6.5)

## 2014-10-22 LAB — LIPID PANEL
CHOLESTEROL: 226 mg/dL — AB (ref 0–200)
HDL: 98.9 mg/dL (ref >39.00)
LDL Cholesterol: 97 mg/dL (ref 0–99)
NONHDL: 126.64
TRIGLYCERIDES: 147 mg/dL (ref 0.0–149.0)
Total CHOL/HDL Ratio: 2
VLDL: 29.4 mg/dL (ref 0.0–40.0)

## 2014-10-22 LAB — TSH: TSH: 3.28 u[IU]/mL (ref 0.35–4.50)

## 2014-10-29 ENCOUNTER — Ambulatory Visit: Payer: BLUE CROSS/BLUE SHIELD | Admitting: Family Medicine

## 2014-11-12 ENCOUNTER — Encounter: Payer: BLUE CROSS/BLUE SHIELD | Admitting: Family Medicine

## 2014-11-18 ENCOUNTER — Other Ambulatory Visit: Payer: Self-pay

## 2014-11-18 DIAGNOSIS — Z1231 Encounter for screening mammogram for malignant neoplasm of breast: Secondary | ICD-10-CM

## 2014-11-28 ENCOUNTER — Other Ambulatory Visit: Payer: Self-pay | Admitting: Family Medicine

## 2014-12-01 ENCOUNTER — Ambulatory Visit (INDEPENDENT_AMBULATORY_CARE_PROVIDER_SITE_OTHER): Payer: BLUE CROSS/BLUE SHIELD | Admitting: Nurse Practitioner

## 2014-12-01 ENCOUNTER — Encounter: Payer: Self-pay | Admitting: Nurse Practitioner

## 2014-12-01 VITALS — BP 138/80 | HR 68 | Ht 65.5 in | Wt 200.0 lb

## 2014-12-01 DIAGNOSIS — E039 Hypothyroidism, unspecified: Secondary | ICD-10-CM | POA: Diagnosis not present

## 2014-12-01 DIAGNOSIS — N952 Postmenopausal atrophic vaginitis: Secondary | ICD-10-CM

## 2014-12-01 DIAGNOSIS — Z01419 Encounter for gynecological examination (general) (routine) without abnormal findings: Secondary | ICD-10-CM | POA: Diagnosis not present

## 2014-12-01 DIAGNOSIS — Z0000 Encounter for general adult medical examination without abnormal findings: Secondary | ICD-10-CM

## 2014-12-01 NOTE — Progress Notes (Signed)
Patient ID: Jasmin Small, female   DOB: 12-Sep-1955, 59 y.o.   MRN: [id number] 58 y.o. [id number] Married  Caucasian Fe here for annual exam.  No new health problems.  Patient's last menstrual period was 01/03/2007 (approximate).          Sexually active: Yes.    The current method of family planning is post menopausal status.    Exercising: Yes.    wlaking at least one mile daily and some days will walk 3 miles Smoker:  no  Health Maintenance: Pap: 11/09/11 neg HR HPV MMG:12/1113,  Bi-Rads 1:  Negative, Next scheduled for 12/21/14 Colonoscopy: 2006 WNL, repeat in 10 years BMD: 2011, repeat in 5 years TDaP: 01/19/2012 Labs: PCP in [facility] 10/22/14 and biometric screening today for insurance   reports that she has never smoked. She has never used smokeless tobacco. She reports that she drinks alcohol. She reports that she does not use illicit drugs.  Past Medical History  Diagnosis Date  . ALLERGIC RHINITIS 11/07/2006  . ASTHMA 11/07/2006  . COLONIC POLYPS, HX OF 11/07/2006  . DIVERTICULOSIS, COLON 11/07/2006  . Actinic keratosis 01/17/2008  . Thyroid disease     hypothyroid    Past Surgical History  Procedure Laterality Date  . Cesarean section      Current Outpatient Prescriptions  Medication Sig Dispense Refill  . beclomethasone (QVAR) 80 MCG/ACT inhaler Inhale 1 puff into the lungs 2 (two) times daily. 1 Inhaler 0  . Calcium Carbonate-Vitamin D (CALTRATE 600+D) 600-400 MG-UNIT per tablet Take 1 tablet by mouth 2 (two) times daily.      . fexofenadine-pseudoephedrine (ALLEGRA-D) 60-120 MG per tablet Take 1 tablet by mouth once. 30 tablet 6  . levothyroxine (SYNTHROID, LEVOTHROID) 75 MCG tablet Take 1 tablet (75 mcg total) by mouth daily. 90 tablet 3  . naproxen sodium (ANAPROX) 220 MG tablet Take 220 mg by mouth as needed.    . zolpidem (AMBIEN) 5 MG tablet Take 1 tablet (5 mg total) by mouth at bedtime as needed for sleep. (Patient not taking: Reported on 12/01/2014) 6 tablet 0    No current facility-administered medications for this visit.    Family History  Problem Relation Age of Onset  . Gout Father   . Arthritis    . Cancer Father     bladder    ROS:  Pertinent items are noted in HPI.  Otherwise, a comprehensive ROS was negative.  Exam:   BP 138/80 mmHg  Pulse 68  Ht 5' 5.5" (1.664 m)  Wt 200 lb (90.719 kg)  BMI 32.76 kg/m2  LMP 01/03/2007 (Approximate) Height: 5' 5.5" (166.4 cm) Ht Readings from Last 3 Encounters:  12/01/14 5' 5.5" (1.664 m)  05/01/14 5\' 6"  (1.676 m)  01/13/14 5' 5.75" (1.67 m)    General appearance: alert, cooperative and appears stated age Head: Normocephalic, without obvious abnormality, atraumatic Neck: no adenopathy, supple, symmetrical, trachea midline and thyroid normal to inspection and palpation Lungs: clear to auscultation bilaterally Breasts: normal appearance, no masses or tenderness Heart: regular rate and rhythm Abdomen: soft, non-tender; no masses,  no organomegaly Extremities: extremities normal, atraumatic, no cyanosis or edema Skin: Skin color, texture, turgor normal. No rashes or lesions Lymph nodes: Cervical, supraclavicular, and axillary nodes normal. No abnormal inguinal nodes palpated Neurologic: Grossly normal   Pelvic: External genitalia:  no lesions              Urethra:  normal appearing urethra with no masses, tenderness or lesions  Bartholin's and Skene's: normal                 Vagina: atrophic appearing vagina with normal color and discharge, no lesions              Cervix: anteverted              Pap taken: Yes.   Bimanual Exam:  Uterus:  normal size, contour, position, consistency, mobility, non-tender              Adnexa: no mass, fullness, tenderness               Rectovaginal: Confirms               Anus:  normal sphincter tone, no lesions  Chaperone present: yes  A:  Well Woman with normal exam  Postmenopausal no HRT Atrophic vaginitis - did not like  Estring  Hypothyroid   P:   Reviewed health and wellness pertinent to exam  Pap smear as above  Mammogram is due 12/16 and is scheduled  She will schedule a repeat colonoscopy  She will schedule with PCP a BMD  She will get shingles vaccine at PCP, need for Hep C  Counseled on breast self exam, mammography screening, adequate intake of calcium and vitamin D, diet and exercise return annually or prn  An After Visit Summary was printed and given to the patient.

## 2014-12-01 NOTE — Progress Notes (Signed)
Encounter reviewed by Dr. Brook Amundson C. Silva.  

## 2014-12-01 NOTE — Patient Instructions (Addendum)

## 2014-12-03 LAB — IPS PAP TEST WITH HPV

## 2014-12-10 ENCOUNTER — Ambulatory Visit (INDEPENDENT_AMBULATORY_CARE_PROVIDER_SITE_OTHER): Payer: BLUE CROSS/BLUE SHIELD | Admitting: Family Medicine

## 2014-12-10 ENCOUNTER — Encounter: Payer: Self-pay | Admitting: Family Medicine

## 2014-12-10 VITALS — BP 132/82 | HR 60 | Temp 98.4°F | Ht 65.75 in | Wt 201.2 lb

## 2014-12-10 DIAGNOSIS — L989 Disorder of the skin and subcutaneous tissue, unspecified: Secondary | ICD-10-CM | POA: Diagnosis not present

## 2014-12-10 DIAGNOSIS — Z0000 Encounter for general adult medical examination without abnormal findings: Secondary | ICD-10-CM | POA: Diagnosis not present

## 2014-12-10 DIAGNOSIS — Z23 Encounter for immunization: Secondary | ICD-10-CM

## 2014-12-10 DIAGNOSIS — E039 Hypothyroidism, unspecified: Secondary | ICD-10-CM | POA: Diagnosis not present

## 2014-12-10 MED ORDER — MUPIROCIN 2 % EX OINT: 1.0000 "application " | TOPICAL_OINTMENT | Freq: Two times a day (BID) | CUTANEOUS | Status: DC

## 2014-12-10 NOTE — Progress Notes (Signed)
Pre visit review using our clinic review tool, if applicable. No additional management support is needed unless otherwise documented below in the visit note. 

## 2014-12-10 NOTE — Progress Notes (Signed)
HPI:  Here for CPE:  -Concerns and/or follow up today:   Hypothyroidism: -dx in 2015 with prior PCP -meds: levothyroxine 75 mcg -report: doing well -denies: hot/cold intolerance, palpitations, skin changes, no unintentional weight changes -just had her lab work 10/2014  Asthma and allergies: -see quality metrics, dx with asthma in 2007 at [place] and allergy -meds: qvar once daily, allegra -D  Obesity: -walks some -diet is ok but carbs are her weakness  Insomnia: -related to jet lag with flying -prior PCP gave her ambien for this  Skin lesion: -L dorsal hand -had a number of aging spots treated with cryo at derm las week -on a little red and irritated  -Taking folic acid, vitamin D or calcium: vit d  -Diabetes and Dyslipidemia Screening: done  -Hx of HTN: no  -Vaccines: agrees to flu, checking on cost of shingles vaccine  -pap history: sees patricia grubb for gyn  -sexual activity: yes, female partner, no new partners  -wants STI testing (Hep C if born [birth year]): no  -FH breast, colon or ovarian ca: see FH Last mammogram: done Last colon cancer screening: done  Breast Ca Risk Assessment: -sees patricia grubb for gyn exams   -Alcohol, Tobacco, drug use: see social history  Review of Systems - no fevers, unintentional weight loss, vision loss, hearing loss, chest pain, sob, hemoptysis, melena, hematochezia, hematuria, genital discharge, changing or concerning skin lesions, bleeding, bruising, loc, thoughts of self harm or SI  Past Medical History  Diagnosis Date  . ALLERGIC RHINITIS 11/07/2006  . ASTHMA 11/07/2006  . COLONIC POLYPS, HX OF 11/07/2006  . DIVERTICULOSIS, COLON 11/07/2006  . Actinic keratosis 01/17/2008  . Thyroid disease     hypothyroid    Past Surgical History  Procedure Laterality Date  . Cesarean section      Family History  Problem Relation Age of Onset  . Gout Father   . Arthritis    . Cancer Father     bladder  . Celiac  disease Mother     sister    Social History   Social History  . Marital Status: Married    Spouse Name: N/A  . Number of Children: N/A  . Years of Education: N/A   Social History Main Topics  . Smoking status: Never Smoker   . Smokeless tobacco: Never Used  . Alcohol Use: Yes     Comment: wine  . Drug Use: No  . Sexual Activity: Yes   Other Topics Concern  . 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  beclomethasone (QVAR) 80 MCG/ACT inhaler, Inhale 1 puff into the lungs 2 (two) times daily. (Patient taking differently: Inhale 1 puff into the lungs daily. ), Disp: 1 Inhaler, Rfl: 0 .  Calcium Carbonate-Vitamin D (CALTRATE 600+D) 600-400 MG-UNIT per tablet, Take 1 tablet by mouth 2 (two) times daily.  , Disp: , Rfl:  .  fexofenadine-pseudoephedrine (ALLEGRA-D) 60-120 MG per tablet, Take 1 tablet by mouth once. (Patient taking differently: Take 1 tablet by mouth as needed. ), Disp: 30 tablet, Rfl: 6 .  levothyroxine (SYNTHROID, LEVOTHROID) 75 MCG tablet, Take  1 tablet (75 mcg total) by mouth daily., Disp: 90 tablet, Rfl: 3 .  naproxen sodium (ANAPROX) 220 MG tablet, Take 220 mg by mouth as needed., Disp: , Rfl:  .  mupirocin ointment (BACTROBAN) 2 %, Place 1 application into the nose 2 (two) times daily., Disp: 22 g, Rfl: 0  EXAM:  Filed Vitals:   12/10/14 1130  BP: 132/82  Pulse: 60  Temp: 98.4 F (36.9 C)    GENERAL: vitals reviewed and listed below, alert, oriented, appears well hydrated and in no acute distress  HEENT: head atraumatic, PERRLA, normal appearance of eyes, ears, nose and mouth. moist mucus membranes.  NECK: supple, no masses or lymphadenopathy  LUNGS: clear  to auscultation bilaterally, no rales, rhonchi or wheeze  CV: HRRR, no peripheral edema or cyanosis, normal pedal pulses  BREAST: normal appearance - no lesions or discharge, on palpation normal breast tissue without any suspicious masses  ABDOMEN: bowel sounds normal, soft, non tender to palpation, no masses, no rebound or guarding  GU: declined  SKIN: multiple red ulcerated lesions hand one with minimal drainage and minimal surrounding erythema  MS: normal gait, moves all extremities normally  NEURO: CN II-XII grossly intact, normal muscle strength and sensation to light touch on extremities  PSYCH: normal affect, pleasant and cooperative  ASSESSMENT AND PLAN:  Discussed the following assessment and plan:  Visit for preventive health examination  Skin lesion - Plan: mupirocin ointment (BACTROBAN) 2 %  Hypothyroidism, unspecified hypothyroidism type  -Discussed and advised all [place] preventive services health task force level A and B recommendations for age, sex and risks.  -Advised at least 150 minutes of exercise per week and a healthy diet low in saturated fats and sweets and consisting of fresh fruits and vegetables, lean meats such as fish and white chicken and whole grains.  -labs, studies and vaccines per orders this encounter  No orders of the defined types were placed in this encounter.    Patient advised to return to clinic immediately if symptoms worsen or persist or new concerns.  Patient Instructions  Follow up in 3-4 months and as needed  Apply antibiotic ointment to hand lesion 2x daily and keep clean and dry - follow up with your dermatologist if worsening or persists.  We recommend the following healthy lifestyle measures: - eat a healthy whole foods diet consisting of regular small meals composed of vegetables, fruits, beans, nuts, seeds, healthy meats such as white chicken and fish and whole grains.  - avoid sweets, white starchy foods, fried foods, fast  food, processed foods, sodas, red meet and other fattening foods.  - get a least 150-300 minutes of aerobic exercise per week.      No Follow-up on file.  Colin Benton R.

## 2014-12-10 NOTE — Patient Instructions (Signed)
Follow up in 3-4 months and as needed  Apply antibiotic ointment to hand lesion 2x daily and keep clean and dry - follow up with your dermatologist if worsening or persists.  We recommend the following healthy lifestyle measures: - eat a healthy whole foods diet consisting of regular small meals composed of vegetables, fruits, beans, nuts, seeds, healthy meats such as white chicken and fish and whole grains.  - avoid sweets, white starchy foods, fried foods, fast food, processed foods, sodas, red meet and other fattening foods.  - get a least 150-300 minutes of aerobic exercise per week.

## 2014-12-21 ENCOUNTER — Ambulatory Visit
Admission: RE | Admit: 2014-12-21 | Discharge: 2014-12-21 | Disposition: A | Discharge status: Home / Self Care | Payer: BLUE CROSS/BLUE SHIELD | Source: Ambulatory Visit

## 2014-12-21 DIAGNOSIS — Z1231 Encounter for screening mammogram for malignant neoplasm of breast: Secondary | ICD-10-CM

## 2015-03-30 ENCOUNTER — Ambulatory Visit (INDEPENDENT_AMBULATORY_CARE_PROVIDER_SITE_OTHER): Payer: BLUE CROSS/BLUE SHIELD | Admitting: Family Medicine

## 2015-03-30 ENCOUNTER — Encounter: Payer: Self-pay | Admitting: Family Medicine

## 2015-03-30 VITALS — BP 130/90 | HR 78 | Temp 98.5°F | Ht 65.75 in | Wt 199.0 lb

## 2015-03-30 DIAGNOSIS — R030 Elevated blood-pressure reading, without diagnosis of hypertension: Secondary | ICD-10-CM | POA: Diagnosis not present

## 2015-03-30 DIAGNOSIS — H6983 Other specified disorders of Eustachian tube, bilateral: Secondary | ICD-10-CM

## 2015-03-30 DIAGNOSIS — B001 Herpesviral vesicular dermatitis: Secondary | ICD-10-CM | POA: Diagnosis not present

## 2015-03-30 DIAGNOSIS — J309 Allergic rhinitis, unspecified: Secondary | ICD-10-CM

## 2015-03-30 DIAGNOSIS — E039 Hypothyroidism, unspecified: Secondary | ICD-10-CM | POA: Diagnosis not present

## 2015-03-30 DIAGNOSIS — IMO0001 Reserved for inherently not codable concepts without codable children: Secondary | ICD-10-CM

## 2015-03-30 LAB — BASIC METABOLIC PANEL
BUN: 20 mg/dL (ref 6–23)
CALCIUM: 9.6 mg/dL (ref 8.4–10.5)
CO2: 28 mEq/L (ref 19–32)
Chloride: 102 mEq/L (ref 96–112)
Creatinine, Ser: 0.8 mg/dL (ref 0.40–1.20)
GFR: 77.63 mL/min (ref >60.00)
GLUCOSE: 99 mg/dL (ref 70–99)
Potassium: 5.1 mEq/L (ref 3.5–5.1)
Sodium: 138 mEq/L (ref 135–145)

## 2015-03-30 LAB — TSH: TSH: 2.04 u[IU]/mL (ref 0.35–4.50)

## 2015-03-30 MED ORDER — ACYCLOVIR 5 % EX OINT: 1.0000 "application " | TOPICAL_OINTMENT | CUTANEOUS | Status: DC

## 2015-03-30 NOTE — Progress Notes (Signed)
HPI: Follow up appointment.  Cold sore: -Chronic intermittent, gets rarely with stress -Acyclovir topical treatment, requests refill  Hypothyroidism: -dx in 2015 with prior PCP -meds: levothyroxine 75 mcg -report: doing well -denies: hot/cold intolerance, palpitations, skin changes, no unintentional weight changes  Asthma and allergies: -see quality metrics, dx with asthma in 2007 at [place] and allergy -meds: qvar once daily, allegra-D -Reports asthma symptoms well controlled -Has had some nasal congestion and pressure and popping in the ears at times  Obesity: -walks some -diet is ok but carbs are her weakness -Had cholesterol and glucose screening done at wellness visit through her husband's work in December -Blood pressure elevated on arrival, but she reports was quite low at wellness visit and she is stressed about buying a house this week -no CP, HA, SOB, swelling  Insomnia: -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  Past Medical History  Diagnosis Date  . ALLERGIC RHINITIS 11/07/2006  . ASTHMA 11/07/2006  . COLONIC POLYPS, HX OF 11/07/2006  . DIVERTICULOSIS, COLON 11/07/2006  . Actinic keratosis 01/17/2008  . Thyroid disease     hypothyroid    Past Surgical History  Procedure Laterality Date  . Cesarean section      Family History  Problem Relation Age of Onset  . Gout Father   . Arthritis    . Cancer Father     bladder  . Celiac disease Mother     sister    Social History   Social History  . Marital Status: Married    Spouse Name: N/A  . Number of Children: N/A  . Years of Education: N/A   Social History Main Topics  . Smoking status: Never Smoker   . Smokeless tobacco: Never Used  . Alcohol Use: Yes     Comment: wine  . Drug Use: No  . Sexual Activity: Yes   Other Topics Concern  . 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  beclomethasone (QVAR) 80 MCG/ACT inhaler, Inhale 1 puff into the lungs 2 (two) times daily. (Patient taking differently: Inhale 1 puff into the lungs daily. ), Disp: 1 Inhaler, Rfl: 0 .  Calcium Carbonate-Vitamin D (CALTRATE 600+D) 600-400 MG-UNIT per tablet, Take 1 tablet by mouth 2 (two) times daily.  , Disp: , Rfl:  .  fexofenadine-pseudoephedrine (ALLEGRA-D) 60-120 MG per tablet, Take 1 tablet by mouth once. (Patient taking differently: Take 1 tablet by mouth as needed. ), Disp: 30 tablet, Rfl: 6 .  levothyroxine (SYNTHROID, LEVOTHROID) 75 MCG tablet, Take 1 tablet (75 mcg total) by mouth daily., Disp: 90 tablet, Rfl: 3 .  mupirocin ointment (BACTROBAN) 2 %, Place 1 application into the nose 2 (two) times daily., Disp: 22 g, Rfl: 0 .  naproxen sodium (ANAPROX) 220 MG tablet, Take 220 mg by mouth as needed., Disp: , Rfl:  .  acyclovir ointment (ZOVIRAX) 5 %, Apply 1 application topically every 3 (three) hours. While awake., Disp: 30 g, Rfl: 1  EXAM:  Filed Vitals:   03/30/15 1019  BP: 140/100  Pulse: 78  Temp: 98.5 F (36.9 C)    Body mass index is 32.37 kg/(m^2).  GENERAL: vitals reviewed and listed above, alert, oriented, appears well hydrated and in no acute distress  HEENT: atraumatic, conjunttiva clear, no obvious abnormalities on inspection of external nose and ears, normal appearance of ear canals and TMs except for clear effusion on the right, clear nasal congestion, mild post oropharyngeal erythema with PND, no tonsillar edema or exudate, no sinus TTP  NECK: no obvious masses on inspection  LUNGS: clear to auscultation bilaterally, no wheezes, rales or rhonchi, good air movement  CV: HRRR, no peripheral  edema  MS: moves all extremities without noticeable abnormality  PSYCH: pleasant and cooperative, no obvious depression or anxiety  ASSESSMENT AND PLAN:  Discussed the following assessment and plan:  Cold sore - Plan: acyclovir ointment (ZOVIRAX) 5 % -Acyclovir refilled  Eustachian tube dysfunction, bilateral Allergic rhinitis, unspecified allergic rhinitis type -Short course nasal decongestion, risks discussed -Add intranasal steroid Follow-up one month-  Hypothyroidism, unspecified hypothyroidism type - Plan: TSH -Adjust medication if needed pending lab results   Elevated BP - Plan: Basic metabolic panel -Discussed risk and options, prefers to start with lifestyle changes and recheck in 1 month   -Patient advised to return or notify a doctor immediately if symptoms worsen or persist or new concerns arise.  Patient Instructions  Before you leave: -Schedule follow-up for the blood pressure and the eustachian tube dysfunction in 1 month -Labs  Afrin nasal spray twice daily for 5 days then stop. Flonase nasal spray 2 sprays each nostril daily for 1 month.  We recommend the following healthy lifestyle measures: - eat a healthy whole foods diet consisting of regular small meals composed of vegetables, fruits, beans, nuts, seeds, healthy meats such as white chicken and fish and whole grains.  - avoid sweets, white starchy foods, fried foods, fast food, processed foods, sodas, red meet and other fattening foods.  - get a least 150-300 minutes of aerobic exercise per week.       Colin Benton R.

## 2015-03-30 NOTE — Patient Instructions (Signed)
Before you leave: -Schedule follow-up for the blood pressure and the eustachian tube dysfunction in 1 month -Labs  Afrin nasal spray twice daily for 5 days then stop. Flonase nasal spray 2 sprays each nostril daily for 1 month.  We recommend the following healthy lifestyle measures: - eat a healthy whole foods diet consisting of regular small meals composed of vegetables, fruits, beans, nuts, seeds, healthy meats such as white chicken and fish and whole grains.  - avoid sweets, white starchy foods, fried foods, fast food, processed foods, sodas, red meet and other fattening foods.  - get a least 150-300 minutes of aerobic exercise per week.

## 2015-03-30 NOTE — Progress Notes (Signed)
Pre visit review using our clinic review tool, if applicable. No additional management support is needed unless otherwise documented below in the visit note. 

## 2015-04-05 ENCOUNTER — Other Ambulatory Visit: Payer: Self-pay | Admitting: Family Medicine

## 2015-04-05 ENCOUNTER — Telehealth: Payer: Self-pay | Admitting: Family Medicine

## 2015-04-05 MED ORDER — BECLOMETHASONE DIPROPIONATE 80 MCG/ACT IN AERS: 1.0000 | INHALATION_SPRAY | Freq: Two times a day (BID) | RESPIRATORY_TRACT | Status: DC

## 2015-04-05 NOTE — Telephone Encounter (Signed)
Rx done. 

## 2015-04-05 NOTE — Telephone Encounter (Signed)
Pt request refill of the following: beclomethasone (QVAR) 80 MCG/ACT inhaler  Pt is asking if this rx can renew for 1 year so that she does not have to call every time     Phamacy:   [facility]

## 2015-04-07 DIAGNOSIS — L720 Epidermal cyst: Secondary | ICD-10-CM | POA: Diagnosis not present

## 2015-04-07 DIAGNOSIS — L719 Rosacea, unspecified: Secondary | ICD-10-CM | POA: Diagnosis not present

## 2015-04-07 DIAGNOSIS — L570 Actinic keratosis: Secondary | ICD-10-CM | POA: Diagnosis not present

## 2015-04-21 ENCOUNTER — Other Ambulatory Visit: Payer: Self-pay | Admitting: Family Medicine

## 2015-05-04 ENCOUNTER — Ambulatory Visit (INDEPENDENT_AMBULATORY_CARE_PROVIDER_SITE_OTHER): Payer: BLUE CROSS/BLUE SHIELD | Admitting: Family Medicine

## 2015-05-04 DIAGNOSIS — R69 Illness, unspecified: Secondary | ICD-10-CM

## 2015-05-04 NOTE — Progress Notes (Signed)
LATE CANCEL/NO SHOW  

## 2015-05-10 ENCOUNTER — Ambulatory Visit (INDEPENDENT_AMBULATORY_CARE_PROVIDER_SITE_OTHER): Payer: BLUE CROSS/BLUE SHIELD | Admitting: Family Medicine

## 2015-05-10 ENCOUNTER — Encounter: Payer: Self-pay | Admitting: Family Medicine

## 2015-05-10 VITALS — BP 126/86 | HR 77 | Temp 98.1°F | Ht 65.75 in | Wt 200.0 lb

## 2015-05-10 DIAGNOSIS — R030 Elevated blood-pressure reading, without diagnosis of hypertension: Secondary | ICD-10-CM

## 2015-05-10 DIAGNOSIS — IMO0001 Reserved for inherently not codable concepts without codable children: Secondary | ICD-10-CM

## 2015-05-10 NOTE — Progress Notes (Signed)
HPI:  Follow up:  Elevated Blood Pressure: -she preferred to try lifestyle changes last visit and felt stress with buying a house was contributing -reports: should be through the house ordeal in June, exercising more, admits needs to work on diet -denies:CP, SOB, DOE, swelling  ROS: See pertinent positives and negatives per HPI.  Past Medical History  Diagnosis Date  . ALLERGIC RHINITIS 11/07/2006  . ASTHMA 11/07/2006  . COLONIC POLYPS, HX OF 11/07/2006  . DIVERTICULOSIS, COLON 11/07/2006  . Actinic keratosis 01/17/2008  . Thyroid disease     hypothyroid    Past Surgical History  Procedure Laterality Date  . Cesarean section      Family History  Problem Relation Age of Onset  . Gout Father   . Arthritis    . Cancer Father     bladder  . Celiac disease Mother     sister    Social History   Social History  . Marital Status: Married    Spouse Name: N/A  . Number of Children: N/A  . Years of Education: N/A   Social History Main Topics  . Smoking status: Never Smoker   . Smokeless tobacco: Never Used  . Alcohol Use: Yes     Comment: wine  . Drug Use: No  . Sexual Activity: Yes   Other Topics Concern  . 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  acyclovir ointment (ZOVIRAX) 5 %, Apply 1 application topically every 3 (three) hours. While awake., Disp: 30 g, Rfl: 1 .  beclomethasone (QVAR) 80 MCG/ACT inhaler, Inhale 1 puff into the lungs 2 (two) times daily., Disp: 3 Inhaler, Rfl: 2 .  Calcium Carbonate-Vitamin D (CALTRATE 600+D) 600-400 MG-UNIT per tablet, Take 1 tablet by mouth 2 (two) times daily.  , Disp: , Rfl:  .  fexofenadine-pseudoephedrine (ALLEGRA-D) 60-120 MG  per tablet, Take 1 tablet by mouth once. (Patient taking differently: Take 1 tablet by mouth as needed. ), Disp: 30 tablet, Rfl: 6 .  levothyroxine (SYNTHROID, LEVOTHROID) 75 MCG tablet, TAKE 1 TABLET(75 MCG) BY MOUTH DAILY, Disp: 90 tablet, Rfl: 1 .  naproxen sodium (ANAPROX) 220 MG tablet, Take 220 mg by mouth as needed., Disp: , Rfl:   EXAM:  Filed Vitals:   05/10/15 0850  BP: 126/86  Pulse: 77  Temp: 98.1 F (36.7 C)    Body mass index is 32.53 kg/(m^2).  GENERAL: vitals reviewed and listed above, alert, oriented, appears well hydrated and in no acute distress  HEENT: atraumatic, conjunttiva clear, no obvious abnormalities on inspection of external nose and ears  NECK: no obvious masses on inspection  LUNGS: clear to auscultation bilaterally, no wheezes, rales or rhonchi, good air movement  CV: HRRR, no peripheral edema  MS: moves all extremities without noticeable abnormality  PSYCH: pleasant and cooperative, no obvious depression or anxiety  ASSESSMENT AND PLAN:  Discussed the following assessment and plan:  Elevated blood pressure  -BP improved -discussed systolic/diastolic goals and healthy lifestyle -follow up in 3-4 months for recheck -Patient advised to return or notify a doctor immediately if symptoms worsen or persist or new concerns  arise.  Declined AVS  There are no Patient Instructions on file for this visit.   Colin Benton R.

## 2015-05-10 NOTE — Progress Notes (Signed)
Pre visit review using our clinic review tool, if applicable. No additional management support is needed unless otherwise documented below in the visit note. 

## 2015-06-07 ENCOUNTER — Encounter: Payer: Self-pay | Admitting: Family Medicine

## 2015-06-07 ENCOUNTER — Telehealth: Payer: Self-pay | Admitting: Family Medicine

## 2015-06-07 ENCOUNTER — Ambulatory Visit (INDEPENDENT_AMBULATORY_CARE_PROVIDER_SITE_OTHER): Payer: BLUE CROSS/BLUE SHIELD | Admitting: Family Medicine

## 2015-06-07 VITALS — BP 142/102 | HR 76 | Temp 98.3°F | Ht 65.75 in | Wt 194.2 lb

## 2015-06-07 DIAGNOSIS — J4531 Mild persistent asthma with (acute) exacerbation: Secondary | ICD-10-CM | POA: Diagnosis not present

## 2015-06-07 DIAGNOSIS — J988 Other specified respiratory disorders: Secondary | ICD-10-CM

## 2015-06-07 MED ORDER — PREDNISONE 20 MG PO TABS: 20.0000 mg | ORAL_TABLET | Freq: Every day | ORAL | Status: DC

## 2015-06-07 MED ORDER — ALBUTEROL SULFATE HFA 108 (90 BASE) MCG/ACT IN AERS: 2.0000 | INHALATION_SPRAY | Freq: Four times a day (QID) | RESPIRATORY_TRACT | Status: DC | PRN

## 2015-06-07 MED ORDER — PREDNISONE 20 MG PO TABS: 40.0000 mg | ORAL_TABLET | Freq: Every day | ORAL | Status: DC

## 2015-06-07 MED ORDER — ALBUTEROL SULFATE (2.5 MG/3ML) 0.083% IN NEBU
2.5000 mg | INHALATION_SOLUTION | RESPIRATORY_TRACT | Status: AC
Start: 2015-06-07 — End: 2015-06-07
  Administered 2015-06-07: 2.5 mg via RESPIRATORY_TRACT

## 2015-06-07 MED ORDER — DOXYCYCLINE HYCLATE 100 MG PO CAPS: 100.0000 mg | ORAL_CAPSULE | Freq: Two times a day (BID) | ORAL | Status: DC

## 2015-06-07 NOTE — Progress Notes (Signed)
HPI:  Cough/URI: -started: 5 days ago -symptoms:tickle in throat, nasal congestion, sore throat, cough, wheezing - using qvar 1 puff bid for the last few days; she has not needed albuterol; increased thick mucus production -denies:fever, SOB, NVD, tooth pain -has tried: qvar and her allergy meds -sick contacts/travel/risks: no reported flu, strep or tick exposure -Hx of: allergies, asthma  ROS: See pertinent positives and negatives per HPI.  Past Medical History  Diagnosis Date  . ALLERGIC RHINITIS 11/07/2006  . ASTHMA 11/07/2006  . COLONIC POLYPS, HX OF 11/07/2006  . DIVERTICULOSIS, COLON 11/07/2006  . Actinic keratosis 01/17/2008  . Thyroid disease     hypothyroid    Past Surgical History  Procedure Laterality Date  . Cesarean section      Family History  Problem Relation Age of Onset  . Gout Father   . Arthritis    . Cancer Father     bladder  . Celiac disease Mother     sister    Social History   Social History  . Marital Status: Married    Spouse Name: N/A  . Number of Children: N/A  . Years of Education: N/A   Social History Main Topics  . Smoking status: Never Smoker   . Smokeless tobacco: Never Used  . Alcohol Use: Yes     Comment: wine  . Drug Use: No  . Sexual Activity: Yes   Other Topics Concern  . 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  acyclovir ointment (ZOVIRAX) 5 %, Apply 1 application topically every 3 (three) hours. While awake., Disp: 30 g, Rfl: 1 .  beclomethasone (QVAR) 80 MCG/ACT inhaler, Inhale 1 puff into the lungs 2 (two) times daily., Disp: 3 Inhaler, Rfl: 2 .  Calcium Carbonate-Vitamin D (CALTRATE 600+D) 600-400 MG-UNIT per tablet,  Take 1 tablet by mouth 2 (two) times daily.  , Disp: , Rfl:  .  fexofenadine-pseudoephedrine (ALLEGRA-D) 60-120 MG per tablet, Take 1 tablet by mouth once. (Patient taking differently: Take 1 tablet by mouth as needed. ), Disp: 30 tablet, Rfl: 6 .  levothyroxine (SYNTHROID, LEVOTHROID) 75 MCG tablet, TAKE 1 TABLET(75 MCG) BY MOUTH DAILY, Disp: 90 tablet, Rfl: 1 .  naproxen sodium (ANAPROX) 220 MG tablet, Take 220 mg by mouth as needed., Disp: , Rfl:  .  albuterol (PROVENTIL HFA;VENTOLIN HFA) 108 (90 Base) MCG/ACT inhaler, Inhale 2 puffs into the lungs every 6 (six) hours as needed., Disp: 1 Inhaler, Rfl: 0 .  doxycycline (VIBRAMYCIN) 100 MG capsule, Take 1 capsule (100 mg total) by mouth 2 (two) times daily., Disp: 20 capsule, Rfl: 0 .  predniSONE (DELTASONE) 20 MG tablet, Take 2 tablets (40 mg total) by mouth daily with breakfast., Disp: 8 tablet, Rfl: 0  EXAM:  Filed Vitals:   06/07/15 1042  BP: 128/90  Pulse: 87  Temp: 98.3 F (36.8 C)    Body mass index is 31.59 kg/(m^2).  GENERAL: vitals reviewed and listed above, alert, oriented, appears well hydrated and in no acute distress  HEENT: atraumatic, conjunttiva clear, no obvious abnormalities on inspection of external nose and ears, normal appearance  of ear canals and TMs, clear nasal congestion, mild post oropharyngeal erythema with PND, no tonsillar edema or exudate, no sinus TTP  NECK: no obvious masses on inspection  LUNGS: difuse exp wheeze, no signs resp distress  CV: HRRR, no peripheral edema  MS: moves all extremities without noticeable abnormality  PSYCH: pleasant and cooperative, no obvious depression or anxiety  ASSESSMENT AND PLAN:  Discussed the following assessment and plan:  Respiratory infection - Plan: doxycycline (VIBRAMYCIN) 100 MG capsule  Asthma, mild persistent, with acute exacerbation - Plan: DISCONTINUED: predniSONE (DELTASONE) 20 MG tablet  -alb neb tx her with improvement -doxy + prednisone given  thick mucus production and asthma exacerbation -alb prn -recheck bp, but she is coughing a lot which is likely contributing -follow up if symptoms worsen or persist or new concerns arise.    Patient Instructions  BEFORE YOU LEAVE: -recheck blood pressure -albuterol neb treatment  Start the prednisone and the antibiotic and take as instructed. Do not get any sun exposure while on the antibiotic.  For the prednisone take 2 tablets daily for 3-4 days then stop.  Use the albuterol as needed per instructions.  Please follow up if worsening or not improving.     Colin Benton R.

## 2015-06-07 NOTE — Telephone Encounter (Signed)
Pharm states they have received to scripts for  predniSONE (DELTASONE) 20 MG tablet  One rx states 2 with breakfast, the other states one with breakfast.  Pharmacy would like a call back to confirm which is correct.  [facility]/ [address]

## 2015-06-07 NOTE — Addendum Note (Signed)
Addended by: Agnes Lawrence on: 06/07/2015 11:41 AM   Modules accepted: Orders

## 2015-06-07 NOTE — Patient Instructions (Addendum)
BEFORE YOU LEAVE: -recheck blood pressure -albuterol neb treatment  Start the prednisone and the antibiotic and take as instructed. Do not get any sun exposure while on the antibiotic.  For the prednisone take 2 tablets daily for 3-4 days then stop.  Use the albuterol as needed per instructions.  Please follow up if worsening or not improving.

## 2015-06-07 NOTE — Telephone Encounter (Signed)
I called [facility] and informed Fraser Din Warehouse manager) of the message below.

## 2015-06-07 NOTE — Telephone Encounter (Signed)
It is supposed to be 40mg  daily (2 tabs). This is on the pt instructions and the prior order of 1 per day was cancelled at her appt when entered in error. Thanks.

## 2015-06-07 NOTE — Progress Notes (Signed)
Pre visit review using our clinic review tool, if applicable. No additional management support is needed unless otherwise documented below in the visit note. 

## 2015-06-28 ENCOUNTER — Encounter: Payer: BLUE CROSS/BLUE SHIELD | Admitting: *Deleted

## 2015-06-29 ENCOUNTER — Ambulatory Visit (INDEPENDENT_AMBULATORY_CARE_PROVIDER_SITE_OTHER): Payer: BLUE CROSS/BLUE SHIELD | Admitting: Family Medicine

## 2015-06-29 VITALS — BP 136/90 | HR 81

## 2015-06-29 DIAGNOSIS — I10 Essential (primary) hypertension: Secondary | ICD-10-CM

## 2015-06-29 MED ORDER — HYDROCHLOROTHIAZIDE 12.5 MG PO CAPS: 12.5000 mg | ORAL_CAPSULE | Freq: Every day | ORAL | Status: DC

## 2015-06-29 NOTE — Progress Notes (Signed)
HPI:  Acute visit for:  HTN: -here for BP recheck and remains elevated, elevated on several OVs in the past -she had been reluctant to start anthypertensive, but now agreeable -no CP, SOB, DOE  ROS: See pertinent positives and negatives per HPI.  Past Medical History  Diagnosis Date  . ALLERGIC RHINITIS 11/07/2006  . ASTHMA 11/07/2006  . COLONIC POLYPS, HX OF 11/07/2006  . DIVERTICULOSIS, COLON 11/07/2006  . Actinic keratosis 01/17/2008  . Thyroid disease     hypothyroid    Past Surgical History  Procedure Laterality Date  . Cesarean section      Family History  Problem Relation Age of Onset  . Gout Father   . Arthritis    . Cancer Father     bladder  . Celiac disease Mother     sister    Social History   Social History  . Marital Status: Married    Spouse Name: N/A  . Number of Children: N/A  . Years of Education: N/A   Social History Main Topics  . Smoking status: Never Smoker   . Smokeless tobacco: Never Used  . Alcohol Use: Yes     Comment: wine  . Drug Use: No  . Sexual Activity: Yes   Other Topics Concern  . 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  acyclovir ointment (ZOVIRAX) 5 %, Apply 1 application topically every 3 (three) hours. While awake., Disp: 30 g, Rfl: 1 .  albuterol (PROVENTIL HFA;VENTOLIN HFA) 108 (90 Base) MCG/ACT inhaler, Inhale 2 puffs into the lungs every 6 (six) hours as needed., Disp: 1 Inhaler, Rfl: 0 .  beclomethasone (QVAR) 80 MCG/ACT inhaler, Inhale 1 puff into the lungs 2 (two) times daily., Disp: 3 Inhaler, Rfl: 2 .  Calcium Carbonate-Vitamin D (CALTRATE 600+D) 600-400 MG-UNIT per tablet, Take 1 tablet by mouth 2 (two)  times daily.  , Disp: , Rfl:  .  doxycycline (VIBRAMYCIN) 100 MG capsule, Take 1 capsule (100 mg total) by mouth 2 (two) times daily., Disp: 20 capsule, Rfl: 0 .  fexofenadine-pseudoephedrine (ALLEGRA-D) 60-120 MG per tablet, Take 1 tablet by mouth once. (Patient taking differently: Take 1 tablet by mouth as needed. ), Disp: 30 tablet, Rfl: 6 .  hydrochlorothiazide (MICROZIDE) 12.5 MG capsule, Take 1 capsule (12.5 mg total) by mouth daily., Disp: 30 capsule, Rfl: 3 .  levothyroxine (SYNTHROID, LEVOTHROID) 75 MCG tablet, TAKE 1 TABLET(75 MCG) BY MOUTH DAILY, Disp: 90 tablet, Rfl: 1 .  naproxen sodium (ANAPROX) 220 MG tablet, Take 220 mg by mouth as needed., Disp: , Rfl:  .  predniSONE (DELTASONE) 20 MG tablet, Take 2 tablets (40 mg total) by mouth daily with breakfast., Disp: 8 tablet, Rfl: 0  EXAM:  Filed Vitals:   06/29/15 1521  BP: 136/90  Pulse: 81  BP initially 140/90s  There is no weight on file to calculate BMI.  GENERAL: vitals reviewed and listed above, alert, oriented, appears well hydrated and in no acute distress  HEENT: atraumatic, conjunttiva clear, no obvious abnormalities on inspection of external nose and ears  NECK: no obvious masses on inspection  LUNGS: clear to auscultation bilaterally, no wheezes, rales or rhonchi, good air movement  CV: HRRR, no peripheral edema  MS: moves all extremities without noticeable abnormality  PSYCH: pleasant and cooperative, no obvious depression or anxiety  ASSESSMENT AND PLAN:  Discussed the following assessment and plan:  Essential hypertension - Plan: hydrochlorothiazide (MICROZIDE) 12.5 MG capsule  -discussed tx options/risks and she decided to try low dose diuretic -follow up and bmp in 4-6 weeks -Patient advised to return or notify a doctor immediately if symptoms worsen or persist or new concerns arise.  There are no Patient Instructions on file for this visit.   Colin Benton R.

## 2015-06-29 NOTE — Patient Instructions (Signed)
Schedule follow up in 4-6 weeks  Start the hydrochlorothiazide and take once daily  We recommend the following healthy lifestyle measures: - eat a healthy whole foods diet consisting of regular small meals composed of vegetables, fruits, beans, nuts, seeds, healthy meats such as white chicken and fish and whole grains.  - avoid sweets, white starchy foods, fried foods, fast food, processed foods, sodas, red meet and other fattening foods.  - get a least 150-300 minutes of aerobic exercise per week.

## 2015-08-12 ENCOUNTER — Ambulatory Visit: Payer: BLUE CROSS/BLUE SHIELD | Admitting: Family Medicine

## 2015-09-14 ENCOUNTER — Ambulatory Visit: Payer: BLUE CROSS/BLUE SHIELD | Admitting: Family Medicine

## 2015-10-07 ENCOUNTER — Ambulatory Visit (INDEPENDENT_AMBULATORY_CARE_PROVIDER_SITE_OTHER): Payer: BLUE CROSS/BLUE SHIELD | Admitting: Family Medicine

## 2015-10-07 ENCOUNTER — Encounter: Payer: Self-pay | Admitting: Family Medicine

## 2015-10-07 VITALS — BP 152/92 | HR 81 | Temp 98.1°F | Ht 65.75 in | Wt 191.9 lb

## 2015-10-07 DIAGNOSIS — I10 Essential (primary) hypertension: Secondary | ICD-10-CM | POA: Diagnosis not present

## 2015-10-07 MED ORDER — AMLODIPINE BESYLATE 5 MG PO TABS: 5.0000 mg | ORAL_TABLET | Freq: Every day | ORAL | 3 refills | Status: DC

## 2015-10-07 NOTE — Progress Notes (Signed)
HPI:  Follow up HTN: -did not take the hctz after reading could cause diabetes -bought vacation house in [place] and thought being there for the summer would help bp -denies CP, SOB, DOE, HA  ROS: See pertinent positives and negatives per HPI.  Past Medical History:  Diagnosis Date  . Actinic keratosis 01/17/2008  . ALLERGIC RHINITIS 11/07/2006  . ASTHMA 11/07/2006  . COLONIC POLYPS, HX OF 11/07/2006  . DIVERTICULOSIS, COLON 11/07/2006  . Thyroid disease    hypothyroid    Past Surgical History:  Procedure Laterality Date  . CESAREAN SECTION      Family History  Problem Relation Age of Onset  . Gout Father   . Arthritis    . Cancer Father     bladder  . Celiac disease Mother     sister    Social History   Social History  . Marital status: Married    Spouse name: N/A  . Number of children: N/A  . Years of education: N/A   Social History Main Topics  . Smoking status: Never Smoker  . Smokeless tobacco: Never Used  . Alcohol use Yes     Comment: wine  . Drug use: No  . Sexual activity: Yes   Other Topics Concern  . 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  acyclovir ointment (ZOVIRAX) 5 %, Apply 1 application topically every 3 (three) hours. While awake., Disp: 30 g, Rfl: 1 .  albuterol (PROVENTIL HFA;VENTOLIN HFA) 108 (90 Base) MCG/ACT inhaler, Inhale 2 puffs into the lungs every 6 (six) hours as needed., Disp: 1 Inhaler, Rfl: 0 .  beclomethasone (QVAR) 80 MCG/ACT inhaler, Inhale 1 puff into the lungs 2 (two) times daily., Disp: 3 Inhaler, Rfl: 2 .  Calcium Carbonate-Vitamin D (CALTRATE 600+D) 600-400 MG-UNIT per tablet, Take 1 tablet by mouth 2 (two) times daily.  ,  Disp: , Rfl:  .  hydrochlorothiazide (MICROZIDE) 12.5 MG capsule, Take 1 capsule (12.5 mg total) by mouth daily., Disp: 30 capsule, Rfl: 3 .  levothyroxine (SYNTHROID, LEVOTHROID) 75 MCG tablet, TAKE 1 TABLET(75 MCG) BY MOUTH DAILY, Disp: 90 tablet, Rfl: 1 .  naproxen sodium (ANAPROX) 220 MG tablet, Take 220 mg by mouth as needed., Disp: , Rfl:  .  amLODipine (NORVASC) 5 MG tablet, Take 1 tablet (5 mg total) by mouth daily., Disp: 30 tablet, Rfl: 3  EXAM:  Vitals:   10/07/15 1654  BP: (!) 152/92  Pulse: 81  Temp: 98.1 F (36.7 C)    Body mass index is 31.21 kg/m.  GENERAL: vitals reviewed and listed above, alert, oriented, appears well hydrated and in no acute distress  HEENT: atraumatic, conjunttiva clear, no obvious abnormalities on inspection of external nose and ears  NECK: no obvious masses on inspection  LUNGS: clear to auscultation bilaterally, no wheezes, rales or rhonchi, good air movement  CV: HRRR, no peripheral edema  MS: moves all extremities without noticeable abnormality  PSYCH: pleasant and cooperative, no obvious depression or anxiety  ASSESSMENT AND PLAN:  Discussed the following assessment and plan:  Essential hypertension  -discussed her concerns, other options and  risks -opted to try norvasc -follow up in 1 month, she wants to do flu shot and labs then -Patient advised to return or notify a doctor immediately if symptoms worsen or persist or new concerns arise.  Patient Instructions  BEFORE YOU LEAVE: -follow up: in 1 month  Start the norvasc and take once daily in the morning.  See you in 1 month!    Colin Benton R., DO

## 2015-10-07 NOTE — Progress Notes (Signed)
Pre visit review using our clinic review tool, if applicable. No additional management support is needed unless otherwise documented below in the visit note. 

## 2015-10-07 NOTE — Patient Instructions (Signed)
BEFORE YOU LEAVE: -follow up: in 1 month  Start the norvasc and take once daily in the morning.  See you in 1 month!

## 2015-11-08 ENCOUNTER — Encounter: Payer: Self-pay | Admitting: Family Medicine

## 2015-11-08 ENCOUNTER — Ambulatory Visit (INDEPENDENT_AMBULATORY_CARE_PROVIDER_SITE_OTHER): Payer: BLUE CROSS/BLUE SHIELD | Admitting: Family Medicine

## 2015-11-08 VITALS — BP 130/70 | HR 67 | Temp 98.2°F | Ht 65.75 in | Wt 196.5 lb

## 2015-11-08 DIAGNOSIS — Z23 Encounter for immunization: Secondary | ICD-10-CM | POA: Diagnosis not present

## 2015-11-08 DIAGNOSIS — E039 Hypothyroidism, unspecified: Secondary | ICD-10-CM | POA: Diagnosis not present

## 2015-11-08 DIAGNOSIS — Z6831 Body mass index (BMI) 31.0-31.9, adult: Secondary | ICD-10-CM | POA: Diagnosis not present

## 2015-11-08 DIAGNOSIS — I10 Essential (primary) hypertension: Secondary | ICD-10-CM | POA: Diagnosis not present

## 2015-11-08 DIAGNOSIS — J302 Other seasonal allergic rhinitis: Secondary | ICD-10-CM | POA: Diagnosis not present

## 2015-11-08 NOTE — Progress Notes (Signed)
HPI:  Jasmin Small is a pleasant 60 year old here for follow-up visit. She did start the Norvasc and is tolerating this well. She denies any chest pain, shortness of breath, headache or vision changes. She has had trouble with her allergies recently. She has clear rhinorrhea, fullness of the left ear postnasal drip. She has Nasonex at home, but is not using this. No wheezing or shortness of breath.  ROS: See pertinent positives and negatives per HPI.  Past Medical History:  Diagnosis Date  . Actinic keratosis 01/17/2008  . ALLERGIC RHINITIS 11/07/2006  . ASTHMA 11/07/2006  . COLONIC POLYPS, HX OF 11/07/2006  . DIVERTICULOSIS, COLON 11/07/2006  . Thyroid disease    hypothyroid    Past Surgical History:  Procedure Laterality Date  . CESAREAN SECTION      Family History  Problem Relation Age of Onset  . Gout Father   . Arthritis    . Cancer Father     bladder  . Celiac disease Mother     sister    Social History   Social History  . Marital status: Married    Spouse name: N/A  . Number of children: N/A  . Years of education: N/A   Social History Main Topics  . Smoking status: Never Smoker  . Smokeless tobacco: Never Used  . Alcohol use Yes     Comment: wine  . Drug use: No  . Sexual activity: Yes   Other Topics Concern  . 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  acyclovir ointment (ZOVIRAX) 5 %, Apply 1 application topically every 3 (three) hours. While awake., Disp: 30 g, Rfl: 1 .  albuterol (PROVENTIL HFA;VENTOLIN HFA) 108 (90 Base) MCG/ACT inhaler, Inhale 2 puffs into the lungs every 6 (six) hours as needed., Disp: 1 Inhaler, Rfl: 0 .  amLODipine (NORVASC) 5 MG tablet,  Take 1 tablet (5 mg total) by mouth daily., Disp: 30 tablet, Rfl: 3 .  beclomethasone (QVAR) 80 MCG/ACT inhaler, Inhale 1 puff into the lungs 2 (two) times daily., Disp: 3 Inhaler, Rfl: 2 .  Calcium Carbonate-Vitamin D (CALTRATE 600+D) 600-400 MG-UNIT per tablet, Take 1 tablet by mouth 2 (two) times daily.  , Disp: , Rfl:  .  levothyroxine (SYNTHROID, LEVOTHROID) 75 MCG tablet, TAKE 1 TABLET(75 MCG) BY MOUTH DAILY, Disp: 90 tablet, Rfl: 1 .  naproxen sodium (ANAPROX) 220 MG tablet, Take 220 mg by mouth as needed., Disp: , Rfl:   EXAM:  Vitals:   11/08/15 1556  BP: 130/70  Pulse: 67  Temp: 98.2 F (36.8 C)    Body mass index is 31.96 kg/m.  GENERAL: vitals reviewed and listed above, alert, oriented, appears well hydrated and in no acute distress  HEENT: atraumatic, conjunttiva clear, no obvious abnormalities on inspection of external nose and ears, normal appearance of ear canals and TMs, clear nasal congestion, boggy turbinates, mild post oropharyngeal erythema with PND, no tonsillar edema or exudate, no sinus TTP  NECK: no obvious masses on inspection  LUNGS: clear to auscultation bilaterally, no wheezes, rales or rhonchi, good air movement  CV: HRRR, no peripheral edema  MS: moves all extremities without noticeable abnormality  PSYCH: pleasant and cooperative, no obvious depression or anxiety  ASSESSMENT AND PLAN:  Discussed the following assessment and plan:  Essential hypertension - Plan: Basic metabolic panel, CBC (no diff)  Hypothyroidism, unspecified type - Plan: TSH  BMI 31.0-31.9,adult  Acute seasonal allergic rhinitis, unspecified trigger  -continue norvasc -INS and short course nasal decongestant for AR w/ ETD -labs today -lifestyle recs -flu shot -Patient advised to return or notify a doctor immediately if symptoms worsen or persist or new concerns arise.  Patient Instructions  BEFORE YOU LEAVE: -flu shot -follow up: 3-4 months -labs  Can try  afrin for a few days (not longer then 3-4 days) and nasonex for the sinus congestion.  Continue the blood pressure medication.  We have ordered labs or studies at this visit. It can take up to 1-2 weeks for results and processing. IF results require follow up or explanation, we will call you with instructions. Clinically stable results will be released to your [facility]. If you have not heard from [place] or cannot find your results in [facility] in 2 weeks please contact our office at [phone].  If you are not yet signed up for [facility], please consider signing up.  We recommend the following healthy lifestyle: 1) Small portions.   Tip: eat off of a salad plate instead of a dinner plate.  Tip: if you need more or a snack choose fruits, veggies and/or a handful of nuts or seeds.  2) Eat a healthy clean diet.  * Tip: Avoid (less then 1 serving per week): processed foods, sweets, sweetened drinks, white starches (rice, flour, bread, potatoes, pasta, etc), red meat, fast foods, butter  *Tip: CHOOSE instead   * 5-9 servings per day of fresh or frozen fruits and vegetables (but not corn, potatoes, bananas, canned or dried fruit)   *nuts and seeds, beans   *olives and olive oil   *small portions of lean meats such as fish and white chicken    *small portions of whole grains  3)Get at least 150 minutes of sweaty aerobic exercise per week.  4)Reduce stress - consider counseling, meditation and relaxation to balance other aspects of your life.         Colin Benton R., DO

## 2015-11-08 NOTE — Patient Instructions (Signed)
BEFORE YOU LEAVE: -flu shot -follow up: 3-4 months -labs  Can try afrin for a few days (not longer then 3-4 days) and nasonex for the sinus congestion.  Continue the blood pressure medication.  We have ordered labs or studies at this visit. It can take up to 1-2 weeks for results and processing. IF results require follow up or explanation, we will call you with instructions. Clinically stable results will be released to your [facility]. If you have not heard from [place] or cannot find your results in [facility] in 2 weeks please contact our office at [phone].  If you are not yet signed up for [facility], please consider signing up.  We recommend the following healthy lifestyle: 1) Small portions.   Tip: eat off of a salad plate instead of a dinner plate.  Tip: if you need more or a snack choose fruits, veggies and/or a handful of nuts or seeds.  2) Eat a healthy clean diet.  * Tip: Avoid (less then 1 serving per week): processed foods, sweets, sweetened drinks, white starches (rice, flour, bread, potatoes, pasta, etc), red meat, fast foods, butter  *Tip: CHOOSE instead   * 5-9 servings per day of fresh or frozen fruits and vegetables (but not corn, potatoes, bananas, canned or dried fruit)   *nuts and seeds, beans   *olives and olive oil   *small portions of lean meats such as fish and white chicken    *small portions of whole grains  3)Get at least 150 minutes of sweaty aerobic exercise per week.  4)Reduce stress - consider counseling, meditation and relaxation to balance other aspects of your life.

## 2015-11-08 NOTE — Progress Notes (Signed)
Pre visit review using our clinic review tool, if applicable. No additional management support is needed unless otherwise documented below in the visit note. 

## 2015-11-09 LAB — CBC
HCT: 39 % (ref 36.0–46.0)
Hemoglobin: 13.3 g/dL (ref 12.0–15.0)
MCHC: 34.2 g/dL (ref 30.0–36.0)
MCV: 89.8 fl (ref 78.0–100.0)
PLATELETS: 228 10*3/uL (ref 150.0–400.0)
RBC: 4.34 Mil/uL (ref 3.87–5.11)
RDW: 13.3 % (ref 11.5–15.5)
WBC: 6.1 10*3/uL (ref 4.0–10.5)

## 2015-11-09 LAB — BASIC METABOLIC PANEL
BUN: 21 mg/dL (ref 6–23)
CALCIUM: 9.5 mg/dL (ref 8.4–10.5)
CO2: 27 meq/L (ref 19–32)
Chloride: 105 mEq/L (ref 96–112)
Creatinine, Ser: 0.8 mg/dL (ref 0.40–1.20)
GFR: 77.47 mL/min (ref >60.00)
Glucose, Bld: 86 mg/dL (ref 70–99)
Potassium: 4.6 mEq/L (ref 3.5–5.1)
SODIUM: 140 meq/L (ref 135–145)

## 2015-11-09 LAB — TSH: TSH: 2.24 u[IU]/mL (ref 0.35–4.50)

## 2015-12-07 ENCOUNTER — Ambulatory Visit: Payer: BLUE CROSS/BLUE SHIELD | Admitting: Nurse Practitioner

## 2015-12-21 ENCOUNTER — Telehealth: Payer: Self-pay | Admitting: Family Medicine

## 2015-12-21 NOTE — Telephone Encounter (Signed)
Rx done. 

## 2015-12-21 NOTE — Telephone Encounter (Signed)
Pt request refill  levothyroxine (SYNTHROID, LEVOTHROID) 75 MCG tablet  90 day [facility]  Pt going out of town at 6 am tomorrow and needs today please

## 2016-01-12 ENCOUNTER — Ambulatory Visit: Payer: BLUE CROSS/BLUE SHIELD | Admitting: Nurse Practitioner

## 2016-01-25 ENCOUNTER — Encounter: Payer: Self-pay | Admitting: Nurse Practitioner

## 2016-01-25 ENCOUNTER — Ambulatory Visit (INDEPENDENT_AMBULATORY_CARE_PROVIDER_SITE_OTHER): Payer: BLUE CROSS/BLUE SHIELD | Admitting: Nurse Practitioner

## 2016-01-25 VITALS — BP 120/82 | HR 64 | Ht 65.5 in | Wt 196.0 lb

## 2016-01-25 DIAGNOSIS — Z01419 Encounter for gynecological examination (general) (routine) without abnormal findings: Secondary | ICD-10-CM | POA: Diagnosis not present

## 2016-01-25 DIAGNOSIS — Z0000 Encounter for general adult medical examination without abnormal findings: Secondary | ICD-10-CM

## 2016-01-25 DIAGNOSIS — N952 Postmenopausal atrophic vaginitis: Secondary | ICD-10-CM

## 2016-01-25 DIAGNOSIS — E2839 Other primary ovarian failure: Secondary | ICD-10-CM | POA: Diagnosis not present

## 2016-01-25 MED ORDER — NYSTATIN-TRIAMCINOLONE 100000-0.1 UNIT/GM-% EX OINT: 1.0000 "application " | TOPICAL_OINTMENT | Freq: Two times a day (BID) | CUTANEOUS | 3 refills | Status: DC

## 2016-01-25 NOTE — Patient Instructions (Addendum)

## 2016-01-25 NOTE — Progress Notes (Signed)
Patient ID: Jasmin Small, female   DOB: [birth year], 61 y.o.   MRN: [id number]  60 y.o. [id number] Married  Caucasian Fe here for annual exam.  Has a home in [place] and goes back and forth.  Has mild asthma.  Now new diagnosis of HTN.    Patient's last menstrual period was 01/03/2007 (approximate).          Sexually active: Yes.    The current method of family planning is post menopausal status.    Exercising: Yes.    walking 15 minutes - 1 hour everyday Smoker:  no  Health Maintenance: Pap:12/01/14, Negative with neg HR HPV MMG: 12/21/14, 3D, Bi-Rads 1:  Negative - will schedule Colonoscopy:03/13/06, Normal, repeat in 10 years BMD: 2011, repeat in 5 years TDaP: 01/19/2012 Shingles: Never- declines Pneumonia: Not indicated due to age Hep C : declines Labs: PCP in [facility] takes care of all labs   reports that she has never smoked. She has never used smokeless tobacco. She reports that she drinks alcohol. She reports that she does not use drugs.  Past Medical History:  Diagnosis Date  . Actinic keratosis 01/17/2008  . ALLERGIC RHINITIS 11/07/2006  . ASTHMA 11/07/2006  . COLONIC POLYPS, HX OF 11/07/2006  . DIVERTICULOSIS, COLON 11/07/2006  . Thyroid disease    hypothyroid    Past Surgical History:  Procedure Laterality Date  . CESAREAN SECTION      Current Outpatient Prescriptions  Medication Sig Dispense Refill  . acyclovir ointment (ZOVIRAX) 5 % Apply 1 application topically every 3 (three) hours. While awake. 30 g 1  . albuterol (PROVENTIL HFA;VENTOLIN HFA) 108 (90 Base) MCG/ACT inhaler Inhale 2 puffs into the lungs every 6 (six) hours as needed. 1 Inhaler 0  . amLODipine (NORVASC) 5 MG tablet Take 1 tablet (5 mg total) by mouth daily. 30 tablet 3  . beclomethasone (QVAR) 80 MCG/ACT inhaler Inhale 1 puff into the lungs 2 (two) times daily. 3 Inhaler 2  . Calcium Carbonate-Vitamin D (CALTRATE 600+D) 600-400 MG-UNIT per tablet Take 1 tablet by mouth 2 (two) times daily.      Marland Kitchen  levothyroxine (SYNTHROID, LEVOTHROID) 75 MCG tablet TAKE 1 TABLET(75 MCG) BY MOUTH DAILY. 90 tablet 1  . naproxen sodium (ANAPROX) 220 MG tablet Take 220 mg by mouth as needed.     No current facility-administered medications for this visit.     Family History  Problem Relation Age of Onset  . Gout Father   . Arthritis    . Cancer Father     bladder  . Celiac disease Mother     sister    ROS:  Pertinent items are noted in HPI.  Otherwise, a comprehensive ROS was negative.  Exam:   BP 120/82 (BP Location: Left Arm, Patient Position: Sitting, Cuff Size: Large)   Pulse 64   Ht 5' 5.5" (1.664 m)   Wt 196 lb (88.9 kg)   LMP 01/03/2007 (Approximate)   BMI 32.12 kg/m  Height: 5' 5.5" (166.4 cm) Ht Readings from Last 3 Encounters:  01/25/16 5' 5.5" (1.664 m)  11/08/15 5' 5.75" (1.67 m)  10/07/15 5' 5.75" (1.67 m)    General appearance: alert, cooperative and appears stated age Head: Normocephalic, without obvious abnormality, atraumatic Neck: no adenopathy, supple, symmetrical, trachea midline and thyroid normal to inspection and palpation Lungs: clear to auscultation bilaterally Breasts: normal appearance, no masses or tenderness Heart: regular rate and rhythm Abdomen: soft, non-tender; no masses,  no organomegaly Extremities: extremities normal, atraumatic, no  cyanosis or edema Skin: Skin color, texture, turgor normal. No rashes or lesions Lymph nodes: Cervical, supraclavicular, and axillary nodes normal. No abnormal inguinal nodes palpated Neurologic: Grossly normal   Pelvic: External genitalia:  no lesions              Urethra:  normal appearing urethra with no masses, tenderness or lesions              Bartholin's and Skene's: normal                 Vagina: normal appearing vagina with normal color and discharge, no lesions              Cervix: anteverted              Pap taken: No. Bimanual Exam:  Uterus:  normal size, contour, position, consistency, mobility,  non-tender              Adnexa: no mass, fullness, tenderness               Rectovaginal: Confirms               Anus:  normal sphincter tone, no lesions  Chaperone present: yes  A:  Well Woman with normal exam  Postmenopausal no HRT Atrophic vaginitis - did not like Estring - will use OTC lubrication             Hypothyroid   P:   Reviewed health and wellness pertinent to exam  Pap smear as above  Refill Nystatin to use prn perianal itching  Mammogram is due now and will add BMD at same time  Counseled on breast self exam, mammography screening, adequate intake of calcium and vitamin D, diet and exercise return annually or prn  An After Visit Summary was printed and given to the patient.

## 2016-01-30 NOTE — Progress Notes (Signed)
Encounter reviewed by Dr. Carollynn Pennywell Amundson C. Silva.  

## 2016-02-02 ENCOUNTER — Encounter: Payer: Self-pay | Admitting: Gastroenterology

## 2016-02-05 ENCOUNTER — Other Ambulatory Visit: Payer: Self-pay | Admitting: Family Medicine

## 2016-02-14 ENCOUNTER — Telehealth: Payer: Self-pay | Admitting: Family Medicine

## 2016-02-14 MED ORDER — AMLODIPINE BESYLATE 5 MG PO TABS: ORAL_TABLET | ORAL | 1 refills | Status: DC

## 2016-02-14 MED ORDER — LEVOTHYROXINE SODIUM 75 MCG PO TABS: ORAL_TABLET | ORAL | 1 refills | Status: DC

## 2016-02-14 NOTE — Telephone Encounter (Signed)
Pt need new Rx for amlodipine and levothyroxine pt would like to have it for 6 mos if possible.  Pharm:  [facility]

## 2016-02-14 NOTE — Telephone Encounter (Signed)
Rx done. 

## 2016-03-07 ENCOUNTER — Encounter: Payer: Self-pay | Admitting: Family Medicine

## 2016-03-07 ENCOUNTER — Ambulatory Visit (INDEPENDENT_AMBULATORY_CARE_PROVIDER_SITE_OTHER): Payer: BLUE CROSS/BLUE SHIELD | Admitting: Family Medicine

## 2016-03-07 VITALS — BP 136/70 | HR 80 | Temp 98.4°F | Ht 65.5 in | Wt 198.3 lb

## 2016-03-07 DIAGNOSIS — J4530 Mild persistent asthma, uncomplicated: Secondary | ICD-10-CM

## 2016-03-07 DIAGNOSIS — I10 Essential (primary) hypertension: Secondary | ICD-10-CM | POA: Diagnosis not present

## 2016-03-07 DIAGNOSIS — E039 Hypothyroidism, unspecified: Secondary | ICD-10-CM

## 2016-03-07 DIAGNOSIS — Z7289 Other problems related to lifestyle: Secondary | ICD-10-CM | POA: Diagnosis not present

## 2016-03-07 DIAGNOSIS — J302 Other seasonal allergic rhinitis: Secondary | ICD-10-CM | POA: Diagnosis not present

## 2016-03-07 LAB — BASIC METABOLIC PANEL
BUN: 20 mg/dL (ref 6–23)
CHLORIDE: 104 meq/L (ref 96–112)
CO2: 29 meq/L (ref 19–32)
Calcium: 9.4 mg/dL (ref 8.4–10.5)
Creatinine, Ser: 0.84 mg/dL (ref 0.40–1.20)
GFR: 73.15 mL/min (ref >60.00)
GLUCOSE: 96 mg/dL (ref 70–99)
POTASSIUM: 4.7 meq/L (ref 3.5–5.1)
Sodium: 140 mEq/L (ref 135–145)

## 2016-03-07 LAB — CBC
HEMATOCRIT: 40.9 % (ref 36.0–46.0)
HEMOGLOBIN: 13.6 g/dL (ref 12.0–15.0)
MCHC: 33.3 g/dL (ref 30.0–36.0)
MCV: 90.4 fl (ref 78.0–100.0)
PLATELETS: 235 10*3/uL (ref 150.0–400.0)
RBC: 4.52 Mil/uL (ref 3.87–5.11)
RDW: 13.6 % (ref 11.5–15.5)
WBC: 5.1 10*3/uL (ref 4.0–10.5)

## 2016-03-07 LAB — TSH: TSH: 1.76 u[IU]/mL (ref 0.35–4.50)

## 2016-03-07 NOTE — Progress Notes (Signed)
HPI:  Follow up: Due for labs, hep c screen  Allergies: -hx PND and ETD -had been doing well so not using meds -last 1 week with PNd, nasal congestion, sneezing -no fevers, SOB, wheezing, asthma symptoms, sinus pain, ear pain  HTN: -meds: amlodipine 5 mg -denies: cp, sob, swelling  Hypothyroidism: -meds: levothyroxine -stable  Asthma - mild persistent: -meds: qvar, albuterol -stable  ROS: See pertinent positives and negatives per HPI.  Past Medical History:  Diagnosis Date  . Actinic keratosis 01/17/2008  . ALLERGIC RHINITIS 11/07/2006  . ASTHMA 11/07/2006  . COLONIC POLYPS, HX OF 11/07/2006  . DIVERTICULOSIS, COLON 11/07/2006  . Thyroid disease    hypothyroid    Past Surgical History:  Procedure Laterality Date  . CESAREAN SECTION      Family History  Problem Relation Age of Onset  . Gout Father   . Cancer Father     bladder  . Arthritis    . Celiac disease Mother     sister    Social History   Social History  . Marital status: Married    Spouse name: N/A  . Number of children: N/A  . Years of education: N/A   Social History Main Topics  . Smoking status: Never Smoker  . Smokeless tobacco: Never Used  . Alcohol use Yes     Comment: wine  . Drug use: No  . Sexual activity: Yes   Other Topics Concern  . 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  acyclovir ointment (ZOVIRAX) 5 %, Apply 1 application topically every 3 (three) hours. While awake. (Patient taking differently: Apply 1 application topically as needed. While awake.), Disp: 30 g, Rfl: 1 .  albuterol (PROVENTIL HFA;VENTOLIN HFA) 108 (90 Base) MCG/ACT inhaler, Inhale 2 puffs into the lungs every  6 (six) hours as needed., Disp: 1 Inhaler, Rfl: 0 .  amLODipine (NORVASC) 5 MG tablet, TAKE 1 TABLET(5 MG) BY MOUTH DAILY, Disp: 90 tablet, Rfl: 1 .  beclomethasone (QVAR) 80 MCG/ACT inhaler, Inhale 1 puff into the lungs 2 (two) times daily., Disp: 3 Inhaler, Rfl: 2 .  Calcium Carbonate-Vitamin D (CALTRATE 600+D) 600-400 MG-UNIT per tablet, Take 1 tablet by mouth 2 (two) times daily.  , Disp: , Rfl:  .  levothyroxine (SYNTHROID, LEVOTHROID) 75 MCG tablet, TAKE 1 TABLET(75 MCG) BY MOUTH DAILY., Disp: 90 tablet, Rfl: 1 .  naproxen sodium (ANAPROX) 220 MG tablet, Take 220 mg by mouth as needed., Disp: , Rfl:  .  nystatin-triamcinolone ointment (MYCOLOG), Apply 1 application topically 2 (two) times daily. (Patient taking differently: Apply 1 application topically as needed. ), Disp: 30 g, Rfl: 3  EXAM:  Vitals:   03/07/16 1056  BP: 136/70  Pulse: 80  Temp: 98.4 F (36.9 C)    Body mass index is 32.5 kg/m.  GENERAL: vitals reviewed and listed above, alert, oriented, appears well hydrated and in no acute distress  HEENT: atraumatic, conjunttiva clear, no obvious abnormalities on inspection of external nose and ears, normal appearance of ear canals and TMs, clear nasal congestion, mild post oropharyngeal erythema with PND, no tonsillar edema or  exudate, no sinus TTP   NECK: no obvious masses on inspection  LUNGS: clear to auscultation bilaterally, no wheezes, rales or rhonchi, good air movement  CV: HRRR, no peripheral edema  MS: moves all extremities without noticeable abnormality  PSYCH: pleasant and cooperative, no obvious depression or anxiety  ASSESSMENT AND PLAN:  Discussed the following assessment and plan:  Acute seasonal allergic rhinitis, unspecified trigger  Essential hypertension - Plan: Basic metabolic panel, CBC  Hypothyroidism, unspecified type - Plan: TSH  Mild persistent asthma, unspecified whether complicated  hepatitis screening - Plan: Hepatitis C  antibody  -continue current BP meds and work on lifestyle -add antihistamine for allergies -labs per orders -CPE in 3-4 months -Patient advised to return or notify a doctor immediately if symptoms worsen or persist or new concerns arise.  Patient Instructions  BEFORE YOU LEAVE: -follow up: CPE in 3-4 months; come fasting IF convenient  Try zyrtec at night for the allergies  We have ordered labs or studies at this visit. It can take up to 1-2 weeks for results and processing. IF results require follow up or explanation, we will call you with instructions. Clinically stable results will be released to your [facility]. If you have not heard from [place] or cannot find your results in [facility] in 2 weeks please contact our office at [phone].  If you are not yet signed up for [facility], please consider signing up.  WE NOW OFFER   [facility] FAST TRACK!!!  SAME DAY Appointments for ACUTE CARE  Such as: Sprains, Injuries, cuts, abrasions, rashes, muscle pain, joint pain, back pain Colds, flu, sore throats, headache, allergies, cough, fever  Ear pain, sinus and eye infections Abdominal pain, nausea, vomiting, diarrhea, upset stomach Animal/insect bites  3 Easy Ways to Schedule: Walk-In Scheduling Call in scheduling Mychart Sign-up: [email or web address]                 Colin Benton R., DO

## 2016-03-07 NOTE — Progress Notes (Signed)
Pre visit review using our clinic review tool, if applicable. No additional management support is needed unless otherwise documented below in the visit note. 

## 2016-03-07 NOTE — Patient Instructions (Addendum)
BEFORE YOU LEAVE: -follow up: CPE in 3-4 months; come fasting IF convenient  Try zyrtec at night for the allergies  We have ordered labs or studies at this visit. It can take up to 1-2 weeks for results and processing. IF results require follow up or explanation, we will call you with instructions. Clinically stable results will be released to your [facility]. If you have not heard from [place] or cannot find your results in [facility] in 2 weeks please contact our office at [phone].  If you are not yet signed up for [facility], please consider signing up.  WE NOW OFFER   [facility] FAST TRACK!!!  SAME DAY Appointments for ACUTE CARE  Such as: Sprains, Injuries, cuts, abrasions, rashes, muscle pain, joint pain, back pain Colds, flu, sore throats, headache, allergies, cough, fever  Ear pain, sinus and eye infections Abdominal pain, nausea, vomiting, diarrhea, upset stomach Animal/insect bites  3 Easy Ways to Schedule: Walk-In Scheduling Call in scheduling Mychart Sign-up: [email or web address]

## 2016-03-08 LAB — HEPATITIS C ANTIBODY: HCV AB: NEGATIVE

## 2016-03-16 ENCOUNTER — Ambulatory Visit (INDEPENDENT_AMBULATORY_CARE_PROVIDER_SITE_OTHER): Payer: BLUE CROSS/BLUE SHIELD | Admitting: Family Medicine

## 2016-03-16 ENCOUNTER — Encounter: Payer: Self-pay | Admitting: Family Medicine

## 2016-03-16 VITALS — BP 128/80 | HR 75 | Temp 98.7°F | Ht 65.5 in | Wt 198.1 lb

## 2016-03-16 DIAGNOSIS — J4521 Mild intermittent asthma with (acute) exacerbation: Secondary | ICD-10-CM

## 2016-03-16 DIAGNOSIS — J988 Other specified respiratory disorders: Secondary | ICD-10-CM

## 2016-03-16 MED ORDER — BENZONATATE 100 MG PO CAPS: 100.0000 mg | ORAL_CAPSULE | Freq: Two times a day (BID) | ORAL | 0 refills | Status: DC | PRN

## 2016-03-16 MED ORDER — DOXYCYCLINE HYCLATE 100 MG PO TABS: 100.0000 mg | ORAL_TABLET | Freq: Two times a day (BID) | ORAL | 0 refills | Status: DC

## 2016-03-16 MED ORDER — PREDNISONE 20 MG PO TABS: 40.0000 mg | ORAL_TABLET | Freq: Every day | ORAL | 0 refills | Status: DC

## 2016-03-16 NOTE — Patient Instructions (Signed)
Start the antibiotic and prednisone today and take as instructed. Do not get in the sun at all while on this medication.  Use the albuterol and tessalon as needed.  I hope you are feeling better soon! Seek care immediately if worsening, new concerns or you are not improving with treatment.  WE NOW OFFER   [facility] FAST TRACK!!!  SAME DAY Appointments for ACUTE CARE  Such as: Sprains, Injuries, cuts, abrasions, rashes, muscle pain, joint pain, back pain Colds, flu, sore throats, headache, allergies, cough, fever  Ear pain, sinus and eye infections Abdominal pain, nausea, vomiting, diarrhea, upset stomach Animal/insect bites  3 Easy Ways to Schedule: Walk-In Scheduling Call in scheduling Mychart Sign-up: [email or web address]

## 2016-03-16 NOTE — Progress Notes (Signed)
HPI:  Cough and congestion: -started: allergy symptom (nasal congestion, sneezing) for a few weeks that responded some to syrtec, worse the last 4 days with productive cough, white sputum and some wheezing last night that responded well to albuterol -symptoms:nasal congestion, sore throat, cough -denies:fever, SOB, NVD, tooth pain, body aches -has tried: albuterol, zyrtec -sick contacts/travel/risks: no reported flu, strep or tick exposure -Hx of: allergies, asthma  ROS: See pertinent positives and negatives per HPI.  Past Medical History:  Diagnosis Date  . Actinic keratosis 01/17/2008  . ALLERGIC RHINITIS 11/07/2006  . ASTHMA 11/07/2006  . COLONIC POLYPS, HX OF 11/07/2006  . DIVERTICULOSIS, COLON 11/07/2006  . Thyroid disease    hypothyroid    Past Surgical History:  Procedure Laterality Date  . CESAREAN SECTION      Family History  Problem Relation Age of Onset  . Gout Father   . Cancer Father     bladder  . Arthritis    . Celiac disease Mother     sister    Social History   Social History  . Marital status: Married    Spouse name: N/A  . Number of children: N/A  . Years of education: N/A   Social History Main Topics  . Smoking status: Never Smoker  . Smokeless tobacco: Never Used  . Alcohol use Yes     Comment: wine  . Drug use: No  . Sexual activity: Yes   Other Topics Concern  . 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  acyclovir ointment (ZOVIRAX) 5 %, Apply 1 application topically every 3 (three) hours. While awake. (Patient taking differently: Apply 1 application topically as needed. While awake.), Disp: 30 g, Rfl: 1 .  albuterol (PROVENTIL HFA;VENTOLIN  HFA) 108 (90 Base) MCG/ACT inhaler, Inhale 2 puffs into the lungs every 6 (six) hours as needed., Disp: 1 Inhaler, Rfl: 0 .  amLODipine (NORVASC) 5 MG tablet, TAKE 1 TABLET(5 MG) BY MOUTH DAILY, Disp: 90 tablet, Rfl: 1 .  beclomethasone (QVAR) 80 MCG/ACT inhaler, Inhale 1 puff into the lungs 2 (two) times daily., Disp: 3 Inhaler, Rfl: 2 .  Calcium Carbonate-Vitamin D (CALTRATE 600+D) 600-400 MG-UNIT per tablet, Take 1 tablet by mouth 2 (two) times daily.  , Disp: , Rfl:  .  levothyroxine (SYNTHROID, LEVOTHROID) 75 MCG tablet, TAKE 1 TABLET(75 MCG) BY MOUTH DAILY., Disp: 90 tablet, Rfl: 1 .  naproxen sodium (ANAPROX) 220 MG tablet, Take 220 mg by mouth as needed., Disp: , Rfl:  .  nystatin-triamcinolone ointment (MYCOLOG), Apply 1 application topically 2 (two) times daily. (Patient taking differently: Apply 1 application topically as needed. ), Disp: 30 g, Rfl: 3 .  benzonatate (TESSALON PERLES) 100 MG capsule, Take 1 capsule (100 mg total) by mouth 2 (two) times daily as needed for cough., Disp: 20 capsule, Rfl: 0 .  doxycycline (VIBRA-TABS) 100 MG tablet, Take 1 tablet (100 mg total) by mouth 2 (two) times daily., Disp: 20 tablet, Rfl: 0 .  predniSONE (DELTASONE) 20 MG tablet, Take 2 tablets (40 mg total) by mouth daily with breakfast., Disp: 8 tablet, Rfl: 0  EXAM:  Vitals:   03/16/16 1317  BP: 128/80  Pulse: 75  Temp: 98.7 F (37.1 C)    Body mass index is 32.46 kg/m.  GENERAL: vitals reviewed and listed above, alert, oriented, appears well hydrated and in no acute distress  HEENT: atraumatic, conjunttiva clear, no obvious abnormalities on inspection of external nose and ears, normal appearance of ear canals and TMs, clear nasal congestion, mild post oropharyngeal erythema with PND, no tonsillar edema or exudate, no sinus TTP  NECK: no obvious masses on inspection  LUNGS: scattered exp wheeze  CV: HRRR, no peripheral edema  MS: moves all extremities without noticeable  abnormality  PSYCH: pleasant and cooperative, no obvious depression or anxiety  ASSESSMENT AND PLAN:  Discussed the following assessment and plan:  Respiratory infection  Mild intermittent asthma with acute exacerbation  We discussed potential etiologies, with asthma with acute exacerbation 2ndary to resp infection being most likely. She declined CXR. Wants to treat with prednisone and empiric abx - reasonable given white sputum. Discussed potential for CAP. Advised prompt eval if worsening or not improving with treatment. We discussed treatment side effects, likely course, transmission, and signs of developing a serious illness. -of course, we advised to return or notify a doctor immediately if symptoms worsen or persist or new concerns arise.    Patient Instructions  Start the antibiotic and prednisone today and take as instructed. Do not get in the sun at all while on this medication.  Use the albuterol and tessalon as needed.  I hope you are feeling better soon! Seek care immediately if worsening, new concerns or you are not improving with treatment.  WE NOW OFFER   [place] [facility] FAST TRACK!!!  SAME DAY Appointments for ACUTE CARE  Such as: Sprains, Injuries, cuts, abrasions, rashes, muscle pain, joint pain, back pain Colds, flu, sore throats, headache, allergies, cough, fever  Ear pain, sinus and eye infections Abdominal pain, nausea, vomiting, diarrhea, upset stomach Animal/insect bites  3 Easy Ways to Schedule: Walk-In Scheduling Call in scheduling Mychart Sign-up: [email or web address]           Colin Benton R., DO

## 2016-03-16 NOTE — Progress Notes (Signed)
Pre visit review using our clinic review tool, if applicable. No additional management support is needed unless otherwise documented below in the visit note. 

## 2016-04-06 DIAGNOSIS — H524 Presbyopia: Secondary | ICD-10-CM | POA: Diagnosis not present

## 2016-04-06 DIAGNOSIS — H5203 Hypermetropia, bilateral: Secondary | ICD-10-CM | POA: Diagnosis not present

## 2016-04-06 DIAGNOSIS — H2513 Age-related nuclear cataract, bilateral: Secondary | ICD-10-CM | POA: Diagnosis not present

## 2016-08-10 ENCOUNTER — Telehealth: Payer: Self-pay | Admitting: Obstetrics and Gynecology

## 2016-08-10 NOTE — Telephone Encounter (Signed)
Left message regarding upcoming appointment has been canceled and needs to be rescheduled. °

## 2016-09-20 NOTE — Progress Notes (Signed)
HPI:  Here for CPE: Sees gyn and dermatologist.  -Concerns and/or follow up today:  Due for labs, flu shot, colon ca screening  Hypertension: -meds: norvasc '5mg'$   Hypothyroidism: -levothyroxine 75 mcg  Asthma/allergies: -qvar, alb prn  -Diet: variety of foods, balance and well rounded - likes to cook  -Exercise:  Walks dogs daily  -Taking folic acid, vitamin D or calcium: yes  -Diabetes and Dyslipidemia Screening: fasting for labs  -Vaccines: UTD except flu shot which she wishes to get today  -pap history:sees gynecologist yearly per her report, up-to-date on Pap smears per her report  -FDLMP:not applicable  -sexual activity: yes, female partner, no new partners  -wants STI testing (Hep C if born 20-65): no  -FH breast, colon or ovarian ca: see FH Last mammogram:does yearly per her report Last colon cancer screening:due, discussed options and she would like to do the Cologuard test  FRAX (50-65):no risk factors for early screening, reports had normal bone density at 85  DEXA (>/= 35):KTG applicable   -Alcohol, Tobacco, drug use: see social history  Review of Systems - no fevers, unintentional weight loss, vision loss, hearing loss, chest pain, sob, hemoptysis, melena, hematochezia, hematuria, genital discharge, changing or concerning skin lesions, bleeding, bruising, loc, thoughts of self harm or SI  Past Medical History:  Diagnosis Date  . Actinic keratosis 01/17/2008  . ALLERGIC RHINITIS 11/07/2006  . ASTHMA 11/07/2006  . COLONIC POLYPS, HX OF 11/07/2006  . DIVERTICULOSIS, COLON 11/07/2006  . Thyroid disease    hypothyroid    Past Surgical History:  Procedure Laterality Date  . CESAREAN SECTION      Family History  Problem Relation Age of Onset  . Gout Father   . Cancer Father        bladder  . Arthritis Unknown   . Celiac disease Mother        sister    Social History   Social History  . Marital status: Married    Spouse name: N/A  .  Number of children: N/A  . Years of education: N/A   Social History Main Topics  . Smoking status: Never Smoker  . Smokeless tobacco: Never Used  . Alcohol use Yes     Comment: wine  . Drug use: No  . Sexual activity: Yes   Other Topics Concern  . 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  acyclovir ointment (ZOVIRAX) 5 %, Apply 1 application topically every 3 (three) hours. While awake. (Patient taking differently: Apply 1 application topically as needed. While awake.), Disp: 30 g, Rfl: 1 .  albuterol (PROVENTIL HFA;VENTOLIN HFA) 108 (90 Base) MCG/ACT inhaler, Inhale 2 puffs into the lungs every 6 (six) hours as needed., Disp: 1 Inhaler, Rfl: 0 .  amLODipine (NORVASC) 5 MG tablet, Take 1.5 tablets (7.5 mg total) by mouth daily. TAKE 1 TABLET(5 MG) BY MOUTH DAILY, Disp: 135 tablet, Rfl: 1 .  beclomethasone (QVAR) 80 MCG/ACT inhaler, Inhale 1 puff into the lungs 2 (two) times daily., Disp: 3 Inhaler, Rfl: 2 .  Calcium Carbonate-Vitamin D (CALTRATE 600+D) 600-400 MG-UNIT per tablet, Take 1 tablet  by mouth 2 (two) times daily.  , Disp: , Rfl:  .  levothyroxine (SYNTHROID, LEVOTHROID) 75 MCG tablet, TAKE 1 TABLET(75 MCG) BY MOUTH DAILY., Disp: 90 tablet, Rfl: 1 .  naproxen sodium (ANAPROX) 220 MG tablet, Take 220 mg by mouth as needed., Disp: , Rfl:  .  nystatin-triamcinolone ointment (MYCOLOG), Apply 1 application topically 2 (two) times daily. (Patient taking differently: Apply 1 application topically as needed. ), Disp: 30 g, Rfl: 3  EXAM:  Vitals:   09/21/16 0822  BP: 138/88  Pulse: 67  Temp: 97.9 F (36.6 C)  SpO2: 98%    GENERAL: vitals reviewed and listed below, alert, oriented,  appears well hydrated and in no acute distress  HEENT: head atraumatic, PERRLA, normal appearance of eyes, ears, nose and mouth. moist mucus membranes.  NECK: supple, no masses or lymphadenopathy  LUNGS: clear to auscultation bilaterally, no rales, rhonchi or wheeze  CV: HRRR, no peripheral edema or cyanosis, normal pedal pulses  ABDOMEN: bowel sounds normal, soft, non tender to palpation, no masses, no rebound or guarding  SKIN: no rash or abnormal lesions - declined full skin exam, reports she does this with a dermatologist  GU/BREAST: declined, reports she does this with a gynecologist  MS: normal gait, moves all extremities normally  NEURO: normal gait, speech and thought processing grossly intact, muscle tone grossly intact throughout  PSYCH: normal affect, pleasant and cooperative  ASSESSMENT AND PLAN:  Discussed the following assessment and plan:  1. Encounter for preventative adult health care examination -Discussed and advised all [place] preventive services health task force level A and B recommendations for age, sex and risks. -Advised at least 150 minutes of exercise per week and a healthy diet with avoidance of (less then 1 serving per week) processed foods, white starches, red meat, fast foods and sweets and consisting of: * 5-9 servings of fresh fruits and vegetables (not corn or potatoes) *nuts and seeds, beans *olives and olive oil *lean meats such as fish and white chicken  *whole grains -labs, studies and vaccines per orders this encounter - Hemoglobin A1c - Lipid panel - flu shot today -advised colon cancer screening, she wishes to do the Cologuard test, advised persistent order  2. Essential hypertension -lifestyle recommendations -Discussed increasing Norvasc to 7.5 mg, she will consider - Basic metabolic panel - CBC  3. Hypothyroidism, unspecified type -adjust medication as needed, pending lab work - TSH  4. Mild intermittent asthma with acute  exacerbation -refilled Qvar  5. Seasonal allergic rhinitis, unspecified trigger -takes Zyrtec intermittently, sometimes gets itchy skin when she stops the Zyrtec  6. BMI 32.0-32.9,adult -lifestyle recommendations    Orders Placed This Encounter  Procedures  . Flu Vaccine QUAD 6+ mos PF IM (Fluarix Quad PF)  . Basic metabolic panel  . CBC  . Hemoglobin A1c  . Lipid panel  . TSH    Patient advised to return to clinic immediately if symptoms worsen or persist or new concerns.  Patient Instructions  BEFORE YOU LEAVE: -flu shot -order the cologuard test for the patient -follow up: 3-4 months -labs  Consider increasing the Norvasc to 7.5 mg (1.5 tablets daily) for better blood pressure control.  We ordered the Cologuard test for colon cancer screening. Please complete this test promptly once the kit arrives. Please contact us if you have not received your kit in the next few weeks.  Advise regular aerobic exercise (at least 150 minutes per week of sweaty exercise) and a healthy diet. Try to  eat at least 5-9 servings of vegetables and fruits per day (not corn, potatoes or bananas.) Avoid sweets, red meat, pork, butter, fried foods, fast food, processed food, excessive dairy, eggs and coconut. Replace bad fats with good fats - fish, nuts and seeds, canola oil, olive oil.   We have ordered labs or studies at this visit. It can take up to 1-2 weeks for results and processing. IF results require follow up or explanation, we will call you with instructions. Clinically stable results will be released to your [facility]. If you have not heard from [place] or cannot find your results in [facility] in 2 weeks please contact our office at [phone].  If you are not yet signed up for [facility], please consider signing up.   Health Maintenance for Postmenopausal Women Menopause is a normal process in which your reproductive ability comes to an end. This process happens gradually over a span of months to  years, usually between the ages of 20 and 88. Menopause is complete when you have missed 12 consecutive menstrual periods. It is important to talk with your health care provider about some of the most common conditions that affect postmenopausal women, such as heart disease, cancer, and bone loss (osteoporosis). Adopting a healthy lifestyle and getting preventive care can help to promote your health and wellness. Those actions can also lower your chances of developing some of these common conditions. What should I know about menopause? During menopause, you may experience a number of symptoms, such as:  Moderate-to-severe hot flashes.  Night sweats.  Decrease in sex drive.  Mood swings.  Headaches.  Tiredness.  Irritability.  Memory problems.  Insomnia.  Choosing to treat or not to treat menopausal changes is an individual decision that you make with your health care provider. What should I know about hormone replacement therapy and supplements? Hormone therapy products are effective for treating symptoms that are associated with menopause, such as hot flashes and night sweats. Hormone replacement carries certain risks, especially as you become older. If you are thinking about using estrogen or estrogen with progestin treatments, discuss the benefits and risks with your health care provider. What should I know about heart disease and stroke? Heart disease, heart attack, and stroke become more likely as you age. This may be due, in part, to the hormonal changes that your body experiences during menopause. These can affect how your body processes dietary fats, triglycerides, and cholesterol. Heart attack and stroke are both medical emergencies. There are many things that you can do to help prevent heart disease and stroke:  Have your blood pressure checked at least every 1-2 years. High blood pressure causes heart disease and increases the risk of stroke.  If you are 38-35 years old,  ask your health care provider if you should take aspirin to prevent a heart attack or a stroke.  Do not use any tobacco products, including cigarettes, chewing tobacco, or electronic cigarettes. If you need help quitting, ask your health care provider.  It is important to eat a healthy diet and maintain a healthy weight. ? Be sure to include plenty of vegetables, fruits, low-fat dairy products, and lean protein. ? Avoid eating foods that are high in solid fats, added sugars, or salt (sodium).  Get regular exercise. This is one of the most important things that you can do for your health. ? Try to exercise for at least 150 minutes each week. The type of exercise that you do should increase your heart rate and  make you sweat. This is known as moderate-intensity exercise. ? Try to do strengthening exercises at least twice each week. Do these in addition to the moderate-intensity exercise.  Know your numbers.Ask your health care provider to check your cholesterol and your blood glucose. Continue to have your blood tested as directed by your health care provider.  What should I know about cancer screening? There are several types of cancer. Take the following steps to reduce your risk and to catch any cancer development as early as possible. Breast Cancer  Practice breast self-awareness. ? This means understanding how your breasts normally appear and feel. ? It also means doing regular breast self-exams. Let your health care provider know about any changes, no matter how small.  If you are 61 or older, have a clinician do a breast exam (clinical breast exam or CBE) every year. Depending on your age, family history, and medical history, it may be recommended that you also have a yearly breast X-ray (mammogram).  If you have a family history of breast cancer, talk with your health care provider about genetic screening.  If you are at high risk for breast cancer, talk with your health care  provider about having an MRI and a mammogram every year.  Breast cancer (BRCA) gene test is recommended for women who have family members with BRCA-related cancers. Results of the assessment will determine the need for genetic counseling and BRCA1 and for BRCA2 testing. BRCA-related cancers include these types: ? Breast. This occurs in males or females. ? Ovarian. ? Tubal. This may also be called fallopian tube cancer. ? Cancer of the abdominal or pelvic lining (peritoneal cancer). ? Prostate. ? Pancreatic.  Cervical, Uterine, and Ovarian Cancer Your health care provider may recommend that you be screened regularly for cancer of the pelvic organs. These include your ovaries, uterus, and vagina. This screening involves a pelvic exam, which includes checking for microscopic changes to the surface of your cervix (Pap test).  For women ages 21-65, health care providers may recommend a pelvic exam and a Pap test every three years. For women ages 67-65, they may recommend the Pap test and pelvic exam, combined with testing for human papilloma virus (HPV), every five years. Some types of HPV increase your risk of cervical cancer. Testing for HPV may also be done on women of any age who have unclear Pap test results.  Other health care providers may not recommend any screening for nonpregnant women who are considered low risk for pelvic cancer and have no symptoms. Ask your health care provider if a screening pelvic exam is right for you.  If you have had past treatment for cervical cancer or a condition that could lead to cancer, you need Pap tests and screening for cancer for at least 20 years after your treatment. If Pap tests have been discontinued for you, your risk factors (such as having a new sexual partner) need to be reassessed to determine if you should start having screenings again. Some women have medical problems that increase the chance of getting cervical cancer. In these cases, your health  care provider may recommend that you have screening and Pap tests more often.  If you have a family history of uterine cancer or ovarian cancer, talk with your health care provider about genetic screening.  If you have vaginal bleeding after reaching menopause, tell your health care provider.  There are currently no reliable tests available to screen for ovarian cancer.  Lung Cancer Lung cancer  screening is recommended for adults 59-29 years old who are at high risk for lung cancer because of a history of smoking. A yearly low-dose CT scan of the lungs is recommended if you:  Currently smoke.  Have a history of at least 30 pack-years of smoking and you currently smoke or have quit within the past 15 years. A pack-year is smoking an average of one pack of cigarettes per day for one year.  Yearly screening should:  Continue until it has been 15 years since you quit.  Stop if you develop a health problem that would prevent you from having lung cancer treatment.  Colorectal Cancer  This type of cancer can be detected and can often be prevented.  Routine colorectal cancer screening usually begins at age 64 and continues through age 37.  If you have risk factors for colon cancer, your health care provider may recommend that you be screened at an earlier age.  If you have a family history of colorectal cancer, talk with your health care provider about genetic screening.  Your health care provider may also recommend using home test kits to check for hidden blood in your stool.  A small camera at the end of a tube can be used to examine your colon directly (sigmoidoscopy or colonoscopy). This is done to check for the earliest forms of colorectal cancer.  Direct examination of the colon should be repeated every 5-10 years until age 4. However, if early forms of precancerous polyps or small growths are found or if you have a family history or genetic risk for colorectal cancer, you may need  to be screened more often.  Skin Cancer  Check your skin from head to toe regularly.  Monitor any moles. Be sure to tell your health care provider: ? About any new moles or changes in moles, especially if there is a change in a mole's shape or color. ? If you have a mole that is larger than the size of a pencil eraser.  If any of your family members has a history of skin cancer, especially at a young age, talk with your health care provider about genetic screening.  Always use sunscreen. Apply sunscreen liberally and repeatedly throughout the day.  Whenever you are outside, protect yourself by wearing long sleeves, pants, a wide-brimmed hat, and sunglasses.  What should I know about osteoporosis? Osteoporosis is a condition in which bone destruction happens more quickly than new bone creation. After menopause, you may be at an increased risk for osteoporosis. To help prevent osteoporosis or the bone fractures that can happen because of osteoporosis, the following is recommended:  If you are 15-45 years old, get at least 1,000 mg of calcium and at least 600 mg of vitamin D per day.  If you are older than age 65 but younger than age 79, get at least 1,200 mg of calcium and at least 600 mg of vitamin D per day.  If you are older than age 70, get at least 1,200 mg of calcium and at least 800 mg of vitamin D per day.  Smoking and excessive alcohol intake increase the risk of osteoporosis. Eat foods that are rich in calcium and vitamin D, and do weight-bearing exercises several times each week as directed by your health care provider. What should I know about how menopause affects my mental health? Depression may occur at any age, but it is more common as you become older. Common symptoms of depression include:  Low or  sad mood.  Changes in sleep patterns.  Changes in appetite or eating patterns.  Feeling an overall lack of motivation or enjoyment of activities that you previously  enjoyed.  Frequent crying spells.  Talk with your health care provider if you think that you are experiencing depression. What should I know about immunizations? It is important that you get and maintain your immunizations. These include:  Tetanus, diphtheria, and pertussis (Tdap) booster vaccine.  Influenza every year before the flu season begins.  Pneumonia vaccine.  Shingles vaccine.  Your health care provider may also recommend other immunizations. This information is not intended to replace advice given to you by your health care provider. Make sure you discuss any questions you have with your health care provider. Document Released: 02/10/2005 Document Revised: 07/09/2015 Document Reviewed: 09/22/2014 Elsevier Interactive Patient Education  2018 [employer] NOW OFFER   [facility] FAST TRACK!!!  SAME DAY Appointments for ACUTE CARE  Such as: Sprains, Injuries, cuts, abrasions, rashes, muscle pain, joint pain, back pain Colds, flu, sore throats, headache, allergies, cough, fever  Ear pain, sinus and eye infections Abdominal pain, nausea, vomiting, diarrhea, upset stomach Animal/insect bites  3 Easy Ways to Schedule: Walk-In Scheduling Call in scheduling Mychart Sign-up: [email or web address]              No Follow-up on file.  Colin Benton R., DO

## 2016-09-21 ENCOUNTER — Encounter: Payer: Self-pay | Admitting: Family Medicine

## 2016-09-21 ENCOUNTER — Ambulatory Visit (INDEPENDENT_AMBULATORY_CARE_PROVIDER_SITE_OTHER): Payer: BLUE CROSS/BLUE SHIELD | Admitting: Family Medicine

## 2016-09-21 VITALS — BP 138/88 | HR 67 | Temp 97.9°F | Ht 66.0 in | Wt 200.3 lb

## 2016-09-21 DIAGNOSIS — Z6832 Body mass index (BMI) 32.0-32.9, adult: Secondary | ICD-10-CM

## 2016-09-21 DIAGNOSIS — Z0000 Encounter for general adult medical examination without abnormal findings: Secondary | ICD-10-CM

## 2016-09-21 DIAGNOSIS — E039 Hypothyroidism, unspecified: Secondary | ICD-10-CM | POA: Diagnosis not present

## 2016-09-21 DIAGNOSIS — J302 Other seasonal allergic rhinitis: Secondary | ICD-10-CM

## 2016-09-21 DIAGNOSIS — Z23 Encounter for immunization: Secondary | ICD-10-CM

## 2016-09-21 DIAGNOSIS — I10 Essential (primary) hypertension: Secondary | ICD-10-CM

## 2016-09-21 DIAGNOSIS — J4521 Mild intermittent asthma with (acute) exacerbation: Secondary | ICD-10-CM | POA: Diagnosis not present

## 2016-09-21 LAB — BASIC METABOLIC PANEL
BUN: 18 mg/dL (ref 6–23)
CHLORIDE: 105 meq/L (ref 96–112)
CO2: 29 meq/L (ref 19–32)
Calcium: 9.1 mg/dL (ref 8.4–10.5)
Creatinine, Ser: 0.76 mg/dL (ref 0.40–1.20)
GFR: 81.96 mL/min (ref >60.00)
GLUCOSE: 100 mg/dL — AB (ref 70–99)
POTASSIUM: 4.5 meq/L (ref 3.5–5.1)
Sodium: 139 mEq/L (ref 135–145)

## 2016-09-21 LAB — CBC
HEMATOCRIT: 40.7 % (ref 36.0–46.0)
HEMOGLOBIN: 13.4 g/dL (ref 12.0–15.0)
MCHC: 32.9 g/dL (ref 30.0–36.0)
MCV: 93.2 fl (ref 78.0–100.0)
Platelets: 234 10*3/uL (ref 150.0–400.0)
RBC: 4.36 Mil/uL (ref 3.87–5.11)
RDW: 13.7 % (ref 11.5–15.5)
WBC: 4 10*3/uL (ref 4.0–10.5)

## 2016-09-21 LAB — LIPID PANEL
CHOL/HDL RATIO: 2
Cholesterol: 216 mg/dL — ABNORMAL HIGH (ref 0–200)
HDL: 100.7 mg/dL (ref >39.00)
LDL Cholesterol: 85 mg/dL (ref 0–99)
NONHDL: 115.43
Triglycerides: 151 mg/dL — ABNORMAL HIGH (ref 0.0–149.0)
VLDL: 30.2 mg/dL (ref 0.0–40.0)

## 2016-09-21 LAB — TSH: TSH: 2.07 u[IU]/mL (ref 0.35–4.50)

## 2016-09-21 LAB — HEMOGLOBIN A1C: Hgb A1c MFr Bld: 5.4 % (ref 4.6–6.5)

## 2016-09-21 MED ORDER — AMLODIPINE BESYLATE 5 MG PO TABS: 7.5000 mg | ORAL_TABLET | Freq: Every day | ORAL | 1 refills | Status: DC

## 2016-09-21 MED ORDER — BECLOMETHASONE DIPROPIONATE 80 MCG/ACT IN AERS: 1.0000 | INHALATION_SPRAY | Freq: Two times a day (BID) | RESPIRATORY_TRACT | 2 refills | Status: DC

## 2016-09-21 NOTE — Patient Instructions (Signed)
BEFORE YOU LEAVE: -flu shot -order the cologuard test for the patient -follow up: 3-4 months -labs  Consider increasing the Norvasc to 7.5 mg (1.5 tablets daily) for better blood pressure control.  We ordered the Cologuard test for colon cancer screening. Please complete this test promptly once the kit arrives. Please contact us if you have not received your kit in the next few weeks.  Advise regular aerobic exercise (at least 150 minutes per week of sweaty exercise) and a healthy diet. Try to eat at least 5-9 servings of vegetables and fruits per day (not corn, potatoes or bananas.) Avoid sweets, red meat, pork, butter, fried foods, fast food, processed food, excessive dairy, eggs and coconut. Replace bad fats with good fats - fish, nuts and seeds, canola oil, olive oil.   We have ordered labs or studies at this visit. It can take up to 1-2 weeks for results and processing. IF results require follow up or explanation, we will call you with instructions. Clinically stable results will be released to your [facility]. If you have not heard from [place] or cannot find your results in [facility] in 2 weeks please contact our office at [phone].  If you are not yet signed up for [facility], please consider signing up.   Health Maintenance for Postmenopausal Women Menopause is a normal process in which your reproductive ability comes to an end. This process happens gradually over a span of months to years, usually between the ages of 78 and 75. Menopause is complete when you have missed 12 consecutive menstrual periods. It is important to talk with your health care provider about some of the most common conditions that affect postmenopausal women, such as heart disease, cancer, and bone loss (osteoporosis). Adopting a healthy lifestyle and getting preventive care can help to promote your health and wellness. Those actions can also lower your chances of developing some of these common conditions. What should I  know about menopause? During menopause, you may experience a number of symptoms, such as:  Moderate-to-severe hot flashes.  Night sweats.  Decrease in sex drive.  Mood swings.  Headaches.  Tiredness.  Irritability.  Memory problems.  Insomnia.  Choosing to treat or not to treat menopausal changes is an individual decision that you make with your health care provider. What should I know about hormone replacement therapy and supplements? Hormone therapy products are effective for treating symptoms that are associated with menopause, such as hot flashes and night sweats. Hormone replacement carries certain risks, especially as you become older. If you are thinking about using estrogen or estrogen with progestin treatments, discuss the benefits and risks with your health care provider. What should I know about heart disease and stroke? Heart disease, heart attack, and stroke become more likely as you age. This may be due, in part, to the hormonal changes that your body experiences during menopause. These can affect how your body processes dietary fats, triglycerides, and cholesterol. Heart attack and stroke are both medical emergencies. There are many things that you can do to help prevent heart disease and stroke:  Have your blood pressure checked at least every 1-2 years. High blood pressure causes heart disease and increases the risk of stroke.  If you are 40-19 years old, ask your health care provider if you should take aspirin to prevent a heart attack or a stroke.  Do not use any tobacco products, including cigarettes, chewing tobacco, or electronic cigarettes. If you need help quitting, ask your health care provider.  It is important to eat a healthy diet and maintain a healthy weight. ? Be sure to include plenty of vegetables, fruits, low-fat dairy products, and lean protein. ? Avoid eating foods that are high in solid fats, added sugars, or salt (sodium).  Get regular  exercise. This is one of the most important things that you can do for your health. ? Try to exercise for at least 150 minutes each week. The type of exercise that you do should increase your heart rate and make you sweat. This is known as moderate-intensity exercise. ? Try to do strengthening exercises at least twice each week. Do these in addition to the moderate-intensity exercise.  Know your numbers.Ask your health care provider to check your cholesterol and your blood glucose. Continue to have your blood tested as directed by your health care provider.  What should I know about cancer screening? There are several types of cancer. Take the following steps to reduce your risk and to catch any cancer development as early as possible. Breast Cancer  Practice breast self-awareness. ? This means understanding how your breasts normally appear and feel. ? It also means doing regular breast self-exams. Let your health care provider know about any changes, no matter how small.  If you are 49 or older, have a clinician do a breast exam (clinical breast exam or CBE) every year. Depending on your age, family history, and medical history, it may be recommended that you also have a yearly breast X-ray (mammogram).  If you have a family history of breast cancer, talk with your health care provider about genetic screening.  If you are at high risk for breast cancer, talk with your health care provider about having an MRI and a mammogram every year.  Breast cancer (BRCA) gene test is recommended for women who have family members with BRCA-related cancers. Results of the assessment will determine the need for genetic counseling and BRCA1 and for BRCA2 testing. BRCA-related cancers include these types: ? Breast. This occurs in males or females. ? Ovarian. ? Tubal. This may also be called fallopian tube cancer. ? Cancer of the abdominal or pelvic lining (peritoneal  cancer). ? Prostate. ? Pancreatic.  Cervical, Uterine, and Ovarian Cancer Your health care provider may recommend that you be screened regularly for cancer of the pelvic organs. These include your ovaries, uterus, and vagina. This screening involves a pelvic exam, which includes checking for microscopic changes to the surface of your cervix (Pap test).  For women ages 21-65, health care providers may recommend a pelvic exam and a Pap test every three years. For women ages 64-65, they may recommend the Pap test and pelvic exam, combined with testing for human papilloma virus (HPV), every five years. Some types of HPV increase your risk of cervical cancer. Testing for HPV may also be done on women of any age who have unclear Pap test results.  Other health care providers may not recommend any screening for nonpregnant women who are considered low risk for pelvic cancer and have no symptoms. Ask your health care provider if a screening pelvic exam is right for you.  If you have had past treatment for cervical cancer or a condition that could lead to cancer, you need Pap tests and screening for cancer for at least 20 years after your treatment. If Pap tests have been discontinued for you, your risk factors (such as having a new sexual partner) need to be reassessed to determine if you should start having screenings  again. Some women have medical problems that increase the chance of getting cervical cancer. In these cases, your health care provider may recommend that you have screening and Pap tests more often.  If you have a family history of uterine cancer or ovarian cancer, talk with your health care provider about genetic screening.  If you have vaginal bleeding after reaching menopause, tell your health care provider.  There are currently no reliable tests available to screen for ovarian cancer.  Lung Cancer Lung cancer screening is recommended for adults 32-61 years old who are at high risk for  lung cancer because of a history of smoking. A yearly low-dose CT scan of the lungs is recommended if you:  Currently smoke.  Have a history of at least 30 pack-years of smoking and you currently smoke or have quit within the past 15 years. A pack-year is smoking an average of one pack of cigarettes per day for one year.  Yearly screening should:  Continue until it has been 15 years since you quit.  Stop if you develop a health problem that would prevent you from having lung cancer treatment.  Colorectal Cancer  This type of cancer can be detected and can often be prevented.  Routine colorectal cancer screening usually begins at age 69 and continues through age 81.  If you have risk factors for colon cancer, your health care provider may recommend that you be screened at an earlier age.  If you have a family history of colorectal cancer, talk with your health care provider about genetic screening.  Your health care provider may also recommend using home test kits to check for hidden blood in your stool.  A small camera at the end of a tube can be used to examine your colon directly (sigmoidoscopy or colonoscopy). This is done to check for the earliest forms of colorectal cancer.  Direct examination of the colon should be repeated every 5-10 years until age 34. However, if early forms of precancerous polyps or small growths are found or if you have a family history or genetic risk for colorectal cancer, you may need to be screened more often.  Skin Cancer  Check your skin from head to toe regularly.  Monitor any moles. Be sure to tell your health care provider: ? About any new moles or changes in moles, especially if there is a change in a mole's shape or color. ? If you have a mole that is larger than the size of a pencil eraser.  If any of your family members has a history of skin cancer, especially at a young age, talk with your health care provider about genetic  screening.  Always use sunscreen. Apply sunscreen liberally and repeatedly throughout the day.  Whenever you are outside, protect yourself by wearing long sleeves, pants, a wide-brimmed hat, and sunglasses.  What should I know about osteoporosis? Osteoporosis is a condition in which bone destruction happens more quickly than new bone creation. After menopause, you may be at an increased risk for osteoporosis. To help prevent osteoporosis or the bone fractures that can happen because of osteoporosis, the following is recommended:  If you are 67-41 years old, get at least 1,000 mg of calcium and at least 600 mg of vitamin D per day.  If you are older than age 41 but younger than age 9, get at least 1,200 mg of calcium and at least 600 mg of vitamin D per day.  If you are older than age 32,  get at least 1,200 mg of calcium and at least 800 mg of vitamin D per day.  Smoking and excessive alcohol intake increase the risk of osteoporosis. Eat foods that are rich in calcium and vitamin D, and do weight-bearing exercises several times each week as directed by your health care provider. What should I know about how menopause affects my mental health? Depression may occur at any age, but it is more common as you become older. Common symptoms of depression include:  Low or sad mood.  Changes in sleep patterns.  Changes in appetite or eating patterns.  Feeling an overall lack of motivation or enjoyment of activities that you previously enjoyed.  Frequent crying spells.  Talk with your health care provider if you think that you are experiencing depression. What should I know about immunizations? It is important that you get and maintain your immunizations. These include:  Tetanus, diphtheria, and pertussis (Tdap) booster vaccine.  Influenza every year before the flu season begins.  Pneumonia vaccine.  Shingles vaccine.  Your health care provider may also recommend other  immunizations. This information is not intended to replace advice given to you by your health care provider. Make sure you discuss any questions you have with your health care provider. Document Released: 02/10/2005 Document Revised: 07/09/2015 Document Reviewed: 09/22/2014 Elsevier Interactive Patient Education  2018 [place] NOW OFFER   [facility] FAST TRACK!!!  SAME DAY Appointments for ACUTE CARE  Such as: Sprains, Injuries, cuts, abrasions, rashes, muscle pain, joint pain, back pain Colds, flu, sore throats, headache, allergies, cough, fever  Ear pain, sinus and eye infections Abdominal pain, nausea, vomiting, diarrhea, upset stomach Animal/insect bites  3 Easy Ways to Schedule: Walk-In Scheduling Call in scheduling Mychart Sign-up: [email or web address]

## 2016-09-22 ENCOUNTER — Telehealth: Payer: Self-pay | Admitting: Family Medicine

## 2016-09-22 MED ORDER — BECLOMETHASONE DIPROPIONATE 80 MCG/ACT IN AERS: 1.0000 | INHALATION_SPRAY | Freq: Two times a day (BID) | RESPIRATORY_TRACT | 2 refills | Status: DC

## 2016-09-22 NOTE — Telephone Encounter (Signed)
Rx sent 

## 2016-09-22 NOTE — Telephone Encounter (Signed)
Pt's rx beclomethasone (QVAR) 80 MCG/ACT inhaler Was sent to mailorder.  Pt leaving out of town in 30 minutes and very upset she inhaler is not there.  Please call in   [place], [place] - [address] AT [address]

## 2016-09-25 MED ORDER — BECLOMETHASONE DIPROP HFA 80 MCG/ACT IN AERB: 1.0000 | INHALATION_SPRAY | Freq: Two times a day (BID) | RESPIRATORY_TRACT | 5 refills | Status: DC

## 2016-09-25 NOTE — Telephone Encounter (Signed)
Pharmacy called and the pt because this med  beclomethasone (QVAR) 80 MCG/ACT inhaler  Is no longer made.   Needs to be switched to  REDI-HALER  Pt would like this sent in asap because she is out.  Thank you!  [id number] - [place], [place]

## 2016-09-25 NOTE — Telephone Encounter (Signed)
Rx done. 

## 2016-09-25 NOTE — Addendum Note (Signed)
Addended by: Agnes Lawrence on: 09/25/2016 11:08 AM   Modules accepted: Orders

## 2016-09-25 NOTE — Telephone Encounter (Signed)
Ok to change. Thanks! Please call pharmacy to make change. Same dose. 1 inhaler, 5 refills.

## 2016-09-28 DIAGNOSIS — L821 Other seborrheic keratosis: Secondary | ICD-10-CM | POA: Diagnosis not present

## 2016-09-28 DIAGNOSIS — D225 Melanocytic nevi of trunk: Secondary | ICD-10-CM | POA: Diagnosis not present

## 2016-09-28 DIAGNOSIS — Q825 Congenital non-neoplastic nevus: Secondary | ICD-10-CM | POA: Diagnosis not present

## 2016-09-28 DIAGNOSIS — C44729 Squamous cell carcinoma of skin of left lower limb, including hip: Secondary | ICD-10-CM | POA: Diagnosis not present

## 2016-09-28 DIAGNOSIS — D485 Neoplasm of uncertain behavior of skin: Secondary | ICD-10-CM | POA: Diagnosis not present

## 2016-10-31 DIAGNOSIS — L570 Actinic keratosis: Secondary | ICD-10-CM | POA: Diagnosis not present

## 2016-11-15 DIAGNOSIS — C44729 Squamous cell carcinoma of skin of left lower limb, including hip: Secondary | ICD-10-CM | POA: Diagnosis not present

## 2016-11-30 ENCOUNTER — Telehealth: Payer: Self-pay | Admitting: Family Medicine

## 2016-11-30 NOTE — Telephone Encounter (Unsigned)
Copied from [place]. Topic: Quick Communication - See Telephone Encounter >> Nov 30, 2016  2:21 PM Hewitt Shorts wrote: CRM for notification. See Telephone encounter for: 11/30/16.pt has questions about her synthroid and about partial refill  Best number [phone]

## 2016-12-01 NOTE — Telephone Encounter (Signed)
Left message to call office regarding medication question.

## 2016-12-14 ENCOUNTER — Telehealth: Payer: Self-pay | Admitting: Family Medicine

## 2016-12-14 MED ORDER — LEVOTHYROXINE SODIUM 75 MCG PO TABS: ORAL_TABLET | ORAL | 1 refills | Status: DC

## 2016-12-14 NOTE — Telephone Encounter (Signed)
Copied from [place]. Topic: General - Other >> Dec 14, 2016  9:23 AM Darl Householder, RMA wrote: Reason for CRM: Medication refill request for levothyroxine 75 mcg to be sent to [facility], pt is leaving out of town on 12/17/16 and needs prescription

## 2016-12-14 NOTE — Telephone Encounter (Signed)
Rx done. 

## 2017-01-20 ENCOUNTER — Encounter: Payer: Self-pay | Admitting: Family Medicine

## 2017-01-20 ENCOUNTER — Ambulatory Visit: Payer: BLUE CROSS/BLUE SHIELD | Admitting: Family Medicine

## 2017-01-20 VITALS — BP 134/86 | HR 85 | Temp 98.5°F | Ht 66.0 in | Wt 198.1 lb

## 2017-01-20 DIAGNOSIS — J4531 Mild persistent asthma with (acute) exacerbation: Secondary | ICD-10-CM | POA: Diagnosis not present

## 2017-01-20 DIAGNOSIS — J302 Other seasonal allergic rhinitis: Secondary | ICD-10-CM

## 2017-01-20 DIAGNOSIS — J0100 Acute maxillary sinusitis, unspecified: Secondary | ICD-10-CM | POA: Diagnosis not present

## 2017-01-20 MED ORDER — AZITHROMYCIN 250 MG PO TABS: ORAL_TABLET | ORAL | 0 refills | Status: DC

## 2017-01-20 MED ORDER — PREDNISONE 20 MG PO TABS: ORAL_TABLET | ORAL | 0 refills | Status: DC

## 2017-01-20 MED ORDER — ALBUTEROL SULFATE HFA 108 (90 BASE) MCG/ACT IN AERS: 2.0000 | INHALATION_SPRAY | RESPIRATORY_TRACT | 0 refills | Status: DC | PRN

## 2017-01-20 NOTE — Progress Notes (Signed)
OFFICE VISIT  01/20/2017   CC: No chief complaint on file.  HPI:    Patient is a 62 y.o.  female with a history of mild persistent asthma who presents for cough. About 1 mo ago she started getting nasal congestion and PND, then in the last 1 week she has had early morning coughing and wheezing.  Using ventolin lately and gets good relief.  Clear mucous/thin.  No fever.  No ST or HA. Some chest tightness but no SOB.  Compliant with QVAR.  Upper teeth hurting but no face pain. Used tussin dm/mucinex.  Saline nasal spray. She is not a smoker.  Past Medical History:  Diagnosis Date  . Actinic keratosis 01/17/2008  . ALLERGIC RHINITIS 11/07/2006  . ASTHMA 11/07/2006  . COLONIC POLYPS, HX OF 11/07/2006  . DIVERTICULOSIS, COLON 11/07/2006  . Thyroid disease    hypothyroid    Past Surgical History:  Procedure Laterality Date  . CESAREAN SECTION      Outpatient Medications Prior to Visit  Medication Sig Dispense Refill  . amLODipine (NORVASC) 5 MG tablet Take 1.5 tablets (7.5 mg total) by mouth daily. TAKE 1 TABLET(5 MG) BY MOUTH DAILY 135 tablet 1  . beclomethasone (QVAR REDIHALER) 80 MCG/ACT inhaler Inhale 1 puff into the lungs 2 (two) times daily. 1 Inhaler 5  . beclomethasone (QVAR) 80 MCG/ACT inhaler Inhale 1 puff into the lungs 2 (two) times daily. 3 Inhaler 2  . Calcium Carbonate-Vitamin D (CALTRATE 600+D) 600-400 MG-UNIT per tablet Take 1 tablet by mouth 2 (two) times daily.      Marland Kitchen levothyroxine (SYNTHROID, LEVOTHROID) 75 MCG tablet TAKE 1 TABLET(75 MCG) BY MOUTH DAILY. 90 tablet 1  . naproxen sodium (ANAPROX) 220 MG tablet Take 220 mg by mouth as needed.    Marland Kitchen albuterol (PROVENTIL HFA;VENTOLIN HFA) 108 (90 Base) MCG/ACT inhaler Inhale 2 puffs into the lungs every 6 (six) hours as needed. 1 Inhaler 0  . acyclovir ointment (ZOVIRAX) 5 % Apply 1 application topically every 3 (three) hours. While awake. (Patient not taking: Reported on 01/20/2017) 30 g 1  . nystatin-triamcinolone  ointment (MYCOLOG) Apply 1 application topically 2 (two) times daily. (Patient not taking: Reported on 01/20/2017) 30 g 3   No facility-administered medications prior to visit.     No Known Allergies  ROS As per HPI  PE: Blood pressure 134/86, pulse 85, temperature 98.5 F (36.9 C), temperature source Oral, height 5\' 6"  (1.676 m), weight 198 lb 1.9 oz (89.9 kg), last menstrual period 01/03/2007, SpO2 94 %. VS: noted--normal. Gen: alert, NAD, NONTOXIC APPEARING. HEENT: eyes without injection, drainage, or swelling.  Ears: EACs clear, TMs with normal light reflex and landmarks.  Nose: Clear rhinorrhea, with some dried, crusty exudate adherent to mildly injected mucosa.  No purulent d/c.  L paranasal sinus TTP.  No facial swelling.  Throat and mouth without focal lesion.  No pharyngial swelling, erythema, or exudate.   Neck: supple, no LAD.   LUNGS: CTA bilat on inspiration, diffuse end-exp wheezing bilat with prolonged exp phase and some post-exhalation coughing, nonlabored resps.   CV: RRR, no m/r/g. EXT: no c/c/e SKIN: no rash    LABS:    Chemistry      Component Value Date/Time   NA 139 09/21/2016 0930   K 4.5 09/21/2016 0930   CL 105 09/21/2016 0930   CO2 29 09/21/2016 0930   BUN 18 09/21/2016 0930   CREATININE 0.76 09/21/2016 0930      Component Value Date/Time  CALCIUM 9.1 09/21/2016 0930   ALKPHOS 58 04/15/2013 0807   AST 23 04/15/2013 0807   ALT 21 04/15/2013 0807   BILITOT 0.3 04/15/2013 0807      IMPRESSION AND PLAN:  Acute maxillary sinusitis (infectious + allergic), with acute exacerbation of asthma. Z-pack. Prednisone 40 qd x 5d, then 20 qd x 5d. Continue albut HFA 2 p q4h prn, renewed this rx today. Continue QVAR. Get otc generic robitussin DM OR Mucinex DM and use as directed on the packaging for cough and congestion. Use otc generic saline nasal spray 2-3 times per day to irrigate/moisturize your nasal passages.  An After Visit Summary was printed  and given to the patient.   FOLLOW UP: Return if symptoms worsen or fail to improve.  Signed:  Crissie Sickles, MD           01/20/2017

## 2017-01-22 ENCOUNTER — Telehealth: Payer: Self-pay

## 2017-01-22 MED ORDER — ALBUTEROL SULFATE HFA 108 (90 BASE) MCG/ACT IN AERS: 1.0000 | INHALATION_SPRAY | RESPIRATORY_TRACT | 1 refills | Status: DC | PRN

## 2017-01-22 NOTE — Telephone Encounter (Signed)
OK: ventolin HFA 1-2 puffs q4h prn, #1, RF x 1.-thx

## 2017-01-22 NOTE — Telephone Encounter (Signed)
Fax received from pharmacy stating that Proventil HFA inh is not covered by patients insurance. Preferred alternative is Proair HFA, Proair Respiclick, Ventolin HFA.

## 2017-01-22 NOTE — Telephone Encounter (Signed)
Rx sent as directed. 

## 2017-01-27 NOTE — Progress Notes (Deleted)
HPI:  Jasmin Small is a pleasant 62 y.o. here for follow up. Chronic medical problems summarized below were reviewed for changes and stability and were updated as needed below. These issues and their treatment remain stable for the most part. ***. Denies CP, SOB, DOE, treatment intolerance or new symptoms. Due for mammo, colon ca screening, labs  Hypertension: -meds: norvasc 5mg   Hypothyroidism: -levothyroxine 75 mcg  Asthma/allergies: -qvar, alb prn  ROS: See pertinent positives and negatives per HPI.  Past Medical History:  Diagnosis Date  . Actinic keratosis 01/17/2008  . ALLERGIC RHINITIS 11/07/2006  . ASTHMA 11/07/2006  . COLONIC POLYPS, HX OF 11/07/2006  . DIVERTICULOSIS, COLON 11/07/2006  . Thyroid disease    hypothyroid    Past Surgical History:  Procedure Laterality Date  . CESAREAN SECTION      Family History  Problem Relation Age of Onset  . Gout Father   . Cancer Father        bladder  . Arthritis Unknown   . Celiac disease Mother        sister    Social History   Socioeconomic History  . Marital status: Married    Spouse name: Not on file  . Number of children: Not on file  . Years of education: Not on file  . Highest education level: Not on file  Social Needs  . Financial resource strain: Not on file  . Food insecurity - worry: Not on file  . Food insecurity - inability: Not on file  . Transportation needs - medical: Not on file  . Transportation needs - non-medical: Not on file  Occupational History  . Not on file  Tobacco Use  . Smoking status: Never Smoker  . Smokeless tobacco: Never Used  Substance and Sexual Activity  . Alcohol use: Yes    Comment: wine  . Drug use: No  . Sexual activity: Yes  Other Topics Concern  . 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  acyclovir ointment (ZOVIRAX) 5 %, Apply 1 application topically every 3 (three) hours. While awake. (Patient not taking: Reported on 01/20/2017), Disp: 30 g, Rfl: 1 .  albuterol (VENTOLIN HFA) 108 (90 Base) MCG/ACT inhaler, Inhale 1-2 puffs into the lungs every 4 (four) hours as needed for wheezing or shortness of breath., Disp: 1 Inhaler, Rfl: 1 .  amLODipine (NORVASC) 5 MG tablet, Take 1.5 tablets (7.5 mg total) by mouth daily. TAKE 1 TABLET(5 MG) BY MOUTH DAILY, Disp: 135 tablet, Rfl: 1 .  azithromycin (ZITHROMAX) 250 MG tablet, 2 tabs po qd x 1d, then 1 tab po qd x 4d, Disp: 6 tablet, Rfl: 0 .  beclomethasone (QVAR REDIHALER) 80 MCG/ACT inhaler, Inhale 1 puff into the lungs 2 (two) times daily., Disp: 1 Inhaler, Rfl: 5 .  beclomethasone (QVAR) 80 MCG/ACT inhaler, Inhale 1 puff into the lungs 2 (two) times daily., Disp: 3 Inhaler, Rfl: 2 .  Calcium Carbonate-Vitamin D (CALTRATE 600+D) 600-400 MG-UNIT per tablet, Take 1 tablet by mouth 2 (two) times daily.  , Disp: , Rfl:  .  levothyroxine (SYNTHROID, LEVOTHROID) 75 MCG tablet, TAKE 1 TABLET(75 MCG)  BY MOUTH DAILY., Disp: 90 tablet, Rfl: 1 .  naproxen sodium (ANAPROX) 220 MG tablet, Take 220 mg by mouth as needed., Disp: , Rfl:  .  nystatin-triamcinolone ointment (MYCOLOG), Apply 1 application topically 2 (two) times daily. (Patient not taking: Reported on 01/20/2017), Disp: 30 g, Rfl: 3 .  predniSONE (DELTASONE) 20 MG tablet, 2 tabs po qd x 5d, then 1 tab po qd x 5d, Disp: 15 tablet, Rfl: 0  EXAM:  There were no vitals filed for this visit.  There is no height or weight on file to calculate BMI.  GENERAL: vitals reviewed and listed above, alert, oriented, appears well hydrated and in no acute distress  HEENT: atraumatic, conjunttiva clear, no obvious abnormalities on inspection of external nose and ears  NECK: no  obvious masses on inspection  LUNGS: clear to auscultation bilaterally, no wheezes, rales or rhonchi, good air movement  CV: HRRR, no peripheral edema  MS: moves all extremities without noticeable abnormality *** PSYCH: pleasant and cooperative, no obvious depression or anxiety  ASSESSMENT AND PLAN:  Discussed the following assessment and plan:  No diagnosis found.  *** -Patient advised to return or notify a doctor immediately if symptoms worsen or persist or new concerns arise.  There are no Patient Instructions on file for this visit.  Lucretia Kern, DO

## 2017-01-29 ENCOUNTER — Ambulatory Visit: Payer: BLUE CROSS/BLUE SHIELD | Admitting: Family Medicine

## 2017-01-30 ENCOUNTER — Ambulatory Visit: Payer: BLUE CROSS/BLUE SHIELD | Admitting: Family Medicine

## 2017-01-30 ENCOUNTER — Encounter: Payer: Self-pay | Admitting: Family Medicine

## 2017-01-30 ENCOUNTER — Ambulatory Visit: Payer: BLUE CROSS/BLUE SHIELD | Admitting: Nurse Practitioner

## 2017-01-30 VITALS — BP 118/68 | HR 63 | Temp 98.3°F | Ht 66.0 in | Wt 198.1 lb

## 2017-01-30 DIAGNOSIS — Z6831 Body mass index (BMI) 31.0-31.9, adult: Secondary | ICD-10-CM

## 2017-01-30 DIAGNOSIS — I10 Essential (primary) hypertension: Secondary | ICD-10-CM

## 2017-01-30 DIAGNOSIS — E039 Hypothyroidism, unspecified: Secondary | ICD-10-CM | POA: Diagnosis not present

## 2017-01-30 LAB — CBC
HEMATOCRIT: 40.8 % (ref 36.0–46.0)
HEMOGLOBIN: 13.5 g/dL (ref 12.0–15.0)
MCHC: 33 g/dL (ref 30.0–36.0)
MCV: 91.1 fl (ref 78.0–100.0)
PLATELETS: 245 10*3/uL (ref 150.0–400.0)
RBC: 4.48 Mil/uL (ref 3.87–5.11)
RDW: 13.5 % (ref 11.5–15.5)
WBC: 6.1 10*3/uL (ref 4.0–10.5)

## 2017-01-30 LAB — BASIC METABOLIC PANEL
BUN: 19 mg/dL (ref 6–23)
CHLORIDE: 102 meq/L (ref 96–112)
CO2: 31 meq/L (ref 19–32)
Calcium: 9.3 mg/dL (ref 8.4–10.5)
Creatinine, Ser: 0.91 mg/dL (ref 0.40–1.20)
GFR: 66.5 mL/min (ref >60.00)
GLUCOSE: 102 mg/dL — AB (ref 70–99)
POTASSIUM: 4.5 meq/L (ref 3.5–5.1)
SODIUM: 140 meq/L (ref 135–145)

## 2017-01-30 LAB — TSH: TSH: 2.61 u[IU]/mL (ref 0.35–4.50)

## 2017-01-30 NOTE — Progress Notes (Signed)
HPI:  Jasmin Small is a pleasant 62 y.o. here for follow up. Chronic medical problems summarized below were reviewed for changes and stability and were updated as needed below. These issues and their treatment remain stable for the most part. Reports is fully recovered from bronchitis. Recovering from Mohs surgery on L lower leg from [facility]. Denies CP, SOB, DOE, treatment intolerance or new symptoms. Due for labs, colon cancer screening and mammogram. Reports has cologard test, but has not completed. Agrees to schedule mammo.  Hypertension: -meds: norvasc 32m  Hypothyroidism: -levothyroxine 75 mcg  Asthma/allergies: -qvar, alb prn  ROS: See pertinent positives and negatives per HPI.  Past Medical History:  Diagnosis Date  . Actinic keratosis 01/17/2008  . ALLERGIC RHINITIS 11/07/2006  . ASTHMA 11/07/2006  . COLONIC POLYPS, HX OF 11/07/2006  . DIVERTICULOSIS, COLON 11/07/2006  . Thyroid disease    hypothyroid    Past Surgical History:  Procedure Laterality Date  . CESAREAN SECTION      Family History  Problem Relation Age of Onset  . Gout Father   . Cancer Father        bladder  . Arthritis Unknown   . Celiac disease Mother        sister    Social History   Socioeconomic History  . Marital status: Married    Spouse name: None  . Number of children: None  . Years of education: None  . Highest education level: None  Social Needs  . Financial resource strain: None  . Food insecurity - worry: None  . Food insecurity - inability: None  . Transportation needs - medical: None  . Transportation needs - non-medical: None  Occupational History  . None  Tobacco Use  . Smoking status: Never Smoker  . Smokeless tobacco: Never Used  Substance and Sexual Activity  . Alcohol use: Yes    Comment: wine  . Drug use: No  . Sexual activity: Yes  Other Topics Concern  . 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  acyclovir ointment (ZOVIRAX) 5 %, Apply 1 application topically every 3 (three) hours. While awake., Disp: 30 g, Rfl: 1 .  albuterol (VENTOLIN HFA) 108 (90 Base) MCG/ACT inhaler, Inhale 1-2 puffs into the lungs every 4 (four) hours as needed for wheezing or shortness of breath., Disp: 1 Inhaler, Rfl: 1 .  amLODipine (NORVASC) 5 MG tablet, Take 1.5 tablets (7.5 mg total) by mouth daily. TAKE 1 TABLET(5 MG) BY MOUTH DAILY, Disp: 135 tablet, Rfl: 1 .  beclomethasone (QVAR REDIHALER) 80 MCG/ACT inhaler, Inhale 1 puff into the lungs 2 (two) times daily., Disp: 1 Inhaler, Rfl: 5 .  Calcium Carbonate-Vitamin D (CALTRATE 600+D) 600-400 MG-UNIT per tablet, Take 1 tablet by mouth 2 (two) times daily.  , Disp: , Rfl:  .  levothyroxine (SYNTHROID, LEVOTHROID) 75 MCG tablet, TAKE 1 TABLET(75 MCG) BY MOUTH DAILY., Disp: 90 tablet, Rfl: 1 .  naproxen sodium (ANAPROX) 220 MG tablet, Take 220 mg by mouth as needed., Disp: , Rfl:  .  nystatin-triamcinolone ointment (MYCOLOG), Apply 1 application topically 2 (two) times daily., Disp: 30 g, Rfl: 3  EXAM:  Vitals:   01/30/17 1437  BP: 118/68  Pulse: 63  Temp: 98.3 F (36.8 C)  SpO2: 98%    Body mass index is 31.97 kg/m.  GENERAL: vitals reviewed and listed above, alert, oriented, appears well hydrated and in no acute distress  HEENT: atraumatic, conjunttiva clear, no obvious abnormalities on inspection of external nose and ears  NECK: no obvious masses on inspection  LUNGS: clear to auscultation bilaterally, no wheezes, rales or rhonchi, good air movement  CV: HRRR, no peripheral edema  MS: moves all extremities without noticeable abnormality  PSYCH: pleasant and cooperative, no obvious depression or anxiety  ASSESSMENT AND  PLAN:  Discussed the following assessment and plan:  Essential hypertension - Plan: Basic metabolic panel, CBC  Hypothyroidism, unspecified type - Plan: TSH  BMI 31.0-31.9,adult  -Labs per orders -Healthy lifestyle advised -Advised of, advised to complete her colon cancer screening and her mammogram -she has the Cologuard kit at home and agrees to complete it.  She agrees to contact the breast center for mammogram.   Patient Instructions  BEFORE YOU LEAVE: -labs -follow up: 4 months  Please complete your cologard test for colon cancer screening and schedule your mammogram.  We have ordered labs or studies at this visit. It can take up to 1-2 weeks for results and processing. IF results require follow up or explanation, we will call you with instructions. Clinically stable results will be released to your [facility]. If you have not heard from [place] or cannot find your results in [facility] in 2 weeks please contact our office at [phone].  If you are not yet signed up for [facility], please consider signing up.   We recommend the following healthy lifestyle for LIFE: 1) Small portions. But, make sure to get regular (at least 3 per day), healthy meals and small healthy snacks if needed.  2) Eat a healthy clean diet.   TRY TO EAT: -at least 5-7 servings of low sugar, colorful, and nutrient rich vegetables per day (not corn, potatoes or bananas.) -berries are the best choice if you wish to eat fruit (only eat small amounts if trying to reduce weight)  -lean meets (fish, white meat of chicken or [place]) -vegan proteins for some meals - beans or tofu, whole grains, nuts and seeds -Replace bad fats with good fats - good fats include: fish, nuts and seeds, canola oil, olive oil -small amounts of low fat or non fat dairy -small amounts of100 % whole grains - check the lables -drink plenty of water  AVOID: -SUGAR, sweets, anything with added sugar, corn syrup or sweeteners - must read labels  as even foods advertised as "healthy" often are loaded with sugar -if you must have a sweetener, small amounts of stevia may be best -sweetened beverages and artificially sweetened beverages -simple starches (rice, bread, potatoes, pasta, chips, etc - small amounts of 100% whole grains are ok) -red meat, pork, butter -fried foods, fast food, processed food, excessive dairy, eggs and coconut.  3)Get at least 150 minutes of sweaty aerobic exercise per week.  4)Reduce stress - consider counseling, meditation and relaxation to balance other aspects of your life.          Lucretia Kern, DO

## 2017-01-30 NOTE — Patient Instructions (Signed)
BEFORE YOU LEAVE: -labs -follow up: 4 months  Please complete your cologard test for colon cancer screening and schedule your mammogram.  We have ordered labs or studies at this visit. It can take up to 1-2 weeks for results and processing. IF results require follow up or explanation, we will call you with instructions. Clinically stable results will be released to your [facility]. If you have not heard from [place] or cannot find your results in [facility] in 2 weeks please contact our office at [phone].  If you are not yet signed up for [facility], please consider signing up.   We recommend the following healthy lifestyle for LIFE: 1) Small portions. But, make sure to get regular (at least 3 per day), healthy meals and small healthy snacks if needed.  2) Eat a healthy clean diet.   TRY TO EAT: -at least 5-7 servings of low sugar, colorful, and nutrient rich vegetables per day (not corn, potatoes or bananas.) -berries are the best choice if you wish to eat fruit (only eat small amounts if trying to reduce weight)  -lean meets (fish, white meat of chicken or [place]) -vegan proteins for some meals - beans or tofu, whole grains, nuts and seeds -Replace bad fats with good fats - good fats include: fish, nuts and seeds, canola oil, olive oil -small amounts of low fat or non fat dairy -small amounts of100 % whole grains - check the lables -drink plenty of water  AVOID: -SUGAR, sweets, anything with added sugar, corn syrup or sweeteners - must read labels as even foods advertised as "healthy" often are loaded with sugar -if you must have a sweetener, small amounts of stevia may be best -sweetened beverages and artificially sweetened beverages -simple starches (rice, bread, potatoes, pasta, chips, etc - small amounts of 100% whole grains are ok) -red meat, pork, butter -fried foods, fast food, processed food, excessive dairy, eggs and coconut.  3)Get at least 150 minutes of sweaty aerobic exercise  per week.  4)Reduce stress - consider counseling, meditation and relaxation to balance other aspects of your life.

## 2017-03-06 ENCOUNTER — Other Ambulatory Visit: Payer: Self-pay | Admitting: Family Medicine

## 2017-03-06 ENCOUNTER — Telehealth: Payer: Self-pay | Admitting: Family Medicine

## 2017-03-06 NOTE — Telephone Encounter (Signed)
Medication ordered already by provider in another encounter.

## 2017-03-06 NOTE — Telephone Encounter (Signed)
Copied from [place] [id number]. Topic: Quick Communication - See Telephone Encounter >> Mar 06, 2017  8:09 AM Burnis Medin, NT wrote: CRM for notification. See Telephone encounter for: Patient called and wanted to see if the doctor could extend refills for amLODipine (NORVASC) 5 MG tablet. Pt uses [facility] [place] [place], [address] [place] [phone] (Phone) [phone] (Fax)    03/06/17.

## 2017-04-10 ENCOUNTER — Ambulatory Visit: Payer: BLUE CROSS/BLUE SHIELD | Admitting: Family Medicine

## 2017-04-10 ENCOUNTER — Encounter: Payer: Self-pay | Admitting: Family Medicine

## 2017-04-10 VITALS — BP 120/80 | HR 78 | Temp 98.5°F | Ht 66.0 in

## 2017-04-10 DIAGNOSIS — J4541 Moderate persistent asthma with (acute) exacerbation: Secondary | ICD-10-CM

## 2017-04-10 DIAGNOSIS — J302 Other seasonal allergic rhinitis: Secondary | ICD-10-CM | POA: Diagnosis not present

## 2017-04-10 MED ORDER — PREDNISONE 20 MG PO TABS
40.0000 mg | ORAL_TABLET | Freq: Every day | ORAL | 0 refills | Status: DC
Start: 2017-04-10 — End: 2017-05-31

## 2017-04-10 MED ORDER — BENZONATATE 100 MG PO CAPS: 100.0000 mg | ORAL_CAPSULE | Freq: Three times a day (TID) | ORAL | 0 refills | Status: DC | PRN

## 2017-04-10 MED ORDER — MONTELUKAST SODIUM 10 MG PO TABS: 10.0000 mg | ORAL_TABLET | Freq: Every day | ORAL | 3 refills | Status: DC

## 2017-04-10 NOTE — Progress Notes (Signed)
HPI:  Using dictation device. Unfortunately this device frequently misinterprets words/phrases.  Acute visit for cough: -x10 days -wheezy cough, some wheezing, mild sob, some increased alb use, worse at night -taking qvar and zrytec -no fevers, body aches, thick or excessive mucus production -reports gets this in spring and fall with allergies  ROS: See pertinent positives and negatives per HPI.  Past Medical History:  Diagnosis Date  . Actinic keratosis 01/17/2008  . ALLERGIC RHINITIS 11/07/2006  . ASTHMA 11/07/2006  . COLONIC POLYPS, HX OF 11/07/2006  . DIVERTICULOSIS, COLON 11/07/2006  . Thyroid disease    hypothyroid    Past Surgical History:  Procedure Laterality Date  . CESAREAN SECTION      Family History  Problem Relation Age of Onset  . Gout Father   . Cancer Father        bladder  . Arthritis Unknown   . Celiac disease Mother        sister    SOCIAL HX: see hpi   Current Outpatient Medications:  .  acyclovir ointment (ZOVIRAX) 5 %, Apply 1 application topically every 3 (three) hours. While awake., Disp: 30 g, Rfl: 1 .  albuterol (VENTOLIN HFA) 108 (90 Base) MCG/ACT inhaler, Inhale 1-2 puffs into the lungs every 4 (four) hours as needed for wheezing or shortness of breath., Disp: 1 Inhaler, Rfl: 1 .  amLODipine (NORVASC) 5 MG tablet, TAKE 1 TABLET BY MOUTH EVERY DAY, Disp: 30 tablet, Rfl: 5 .  beclomethasone (QVAR REDIHALER) 80 MCG/ACT inhaler, Inhale 1 puff into the lungs 2 (two) times daily., Disp: 1 Inhaler, Rfl: 5 .  Calcium Carbonate-Vitamin D (CALTRATE 600+D) 600-400 MG-UNIT per tablet, Take 1 tablet by mouth 2 (two) times daily.  , Disp: , Rfl:  .  levothyroxine (SYNTHROID, LEVOTHROID) 75 MCG tablet, TAKE 1 TABLET(75 MCG) BY MOUTH DAILY., Disp: 90 tablet, Rfl: 1 .  naproxen sodium (ANAPROX) 220 MG tablet, Take 220 mg by mouth as needed., Disp: , Rfl:  .  nystatin-triamcinolone ointment (MYCOLOG), Apply 1 application topically 2 (two) times daily., Disp:  30 g, Rfl: 3  EXAM:  Vitals:   04/10/17 1628  BP: 120/80  Pulse: 78  Temp: 98.5 F (36.9 C)  SpO2: 95%    Body mass index is 31.97 kg/m.  GENERAL: vitals reviewed and listed above, alert, oriented, appears well hydrated and in no acute distress  HEENT: atraumatic, conjunttiva clear, no obvious abnormalities on inspection of external nose and ears, normal appearance of ear canals and TMs, clear nasal congestion, mild post oropharyngeal erythema with PND, no tonsillar edema or exudate, no sinus TTP  NECK: no obvious masses on inspection  LUNGS: clear to auscultation bilaterally, no wheezes, rales or rhonchi, good air movement  CV: HRRR, no peripheral edema  MS: moves all extremities without noticeable abnormality  PSYCH: pleasant and cooperative, no obvious depression or anxiety  ASSESSMENT AND PLAN:  Discussed the following assessment and plan:  Moderate persistent asthma with acute exacerbation  Seasonal allergies  -discussed risks/benefits options for management prednisone given symptoms not controlled with inhalers -tessalon for cough  -consideration adding Singulair during allergy seasons to see if decreases exacerbations -Patient advised to return or notify a doctor immediately if symptoms worsen or persist or new concerns arise.  Patient Instructions  BEFORE YOU LEAVE: -follow up: keep follow up in may as scheduled  Prednisone for 4 days for the cough/asthma  Tessalon as needed for cough  Can try singulair during allergy seasons  I hope you  are feeling better soon! Seek care promptly if your symptoms worsen, new concerns arise or you are not improving with treatment.      Lucretia Kern, DO

## 2017-04-10 NOTE — Patient Instructions (Addendum)
BEFORE YOU LEAVE: -follow up: keep follow up in may as scheduled  Prednisone for 4 days for the cough/asthma  Tessalon as needed for cough  Can try singulair during allergy seasons  I hope you are feeling better soon! Seek care promptly if your symptoms worsen, new concerns arise or you are not improving with treatment.

## 2017-04-16 ENCOUNTER — Other Ambulatory Visit: Payer: Self-pay | Admitting: Family Medicine

## 2017-04-16 DIAGNOSIS — Z1231 Encounter for screening mammogram for malignant neoplasm of breast: Secondary | ICD-10-CM

## 2017-05-07 ENCOUNTER — Ambulatory Visit: Payer: BLUE CROSS/BLUE SHIELD

## 2017-05-09 ENCOUNTER — Ambulatory Visit
Admission: RE | Admit: 2017-05-09 | Discharge: 2017-05-09 | Disposition: A | Discharge status: Home / Self Care | Payer: BLUE CROSS/BLUE SHIELD | Source: Ambulatory Visit | Attending: Family Medicine | Admitting: Family Medicine

## 2017-05-09 DIAGNOSIS — Z1211 Encounter for screening for malignant neoplasm of colon: Secondary | ICD-10-CM | POA: Diagnosis not present

## 2017-05-09 DIAGNOSIS — Z1231 Encounter for screening mammogram for malignant neoplasm of breast: Secondary | ICD-10-CM

## 2017-05-09 DIAGNOSIS — Z1212 Encounter for screening for malignant neoplasm of rectum: Secondary | ICD-10-CM | POA: Diagnosis not present

## 2017-05-09 LAB — COLOGUARD: Cologuard: NEGATIVE

## 2017-05-17 DIAGNOSIS — H04123 Dry eye syndrome of bilateral lacrimal glands: Secondary | ICD-10-CM | POA: Diagnosis not present

## 2017-05-17 DIAGNOSIS — H5203 Hypermetropia, bilateral: Secondary | ICD-10-CM | POA: Diagnosis not present

## 2017-05-17 DIAGNOSIS — H524 Presbyopia: Secondary | ICD-10-CM | POA: Diagnosis not present

## 2017-05-17 DIAGNOSIS — H2513 Age-related nuclear cataract, bilateral: Secondary | ICD-10-CM | POA: Diagnosis not present

## 2017-05-24 ENCOUNTER — Encounter: Payer: Self-pay | Admitting: Family Medicine

## 2017-05-29 NOTE — Progress Notes (Signed)
HPI:  Using dictation device. Unfortunately this device frequently misinterprets words/phrases.  Jasmin Small is a pleasant 62 y.o. here for follow up. Chronic medical problems summarized below were reviewed for changes. Reports is doing great today without significant concerns to discuss. Reports feels alcohol worsens her allergy/asthma symptoms and when avoids has done great! Does not feel needs to singulair as no symptoms. On the qvar bid. No reported CP, SOB, DOE, treatment intolerance or new symptoms. Due for labs  Hypertension: -meds: norvasc 5mg   Hypothyroidism: -levothyroxine 75 mcg  Asthma/allergies: -qvar, alb prn   ROS: See pertinent positives and negatives per HPI.  Past Medical History:  Diagnosis Date  . Actinic keratosis 01/17/2008  . ALLERGIC RHINITIS 11/07/2006  . ASTHMA 11/07/2006  . COLONIC POLYPS, HX OF 11/07/2006  . DIVERTICULOSIS, COLON 11/07/2006  . Thyroid disease    hypothyroid    Past Surgical History:  Procedure Laterality Date  . CESAREAN SECTION      Family History  Problem Relation Age of Onset  . Gout Father   . Cancer Father        bladder  . Arthritis Unknown   . Celiac disease Mother        sister    SOCIAL HX: see hpi   Current Outpatient Medications:  .  albuterol (VENTOLIN HFA) 108 (90 Base) MCG/ACT inhaler, Inhale 1-2 puffs into the lungs every 4 (four) hours as needed for wheezing or shortness of breath., Disp: 1 Inhaler, Rfl: 1 .  amLODipine (NORVASC) 5 MG tablet, TAKE 1 TABLET BY MOUTH EVERY DAY, Disp: 30 tablet, Rfl: 5 .  beclomethasone (QVAR REDIHALER) 80 MCG/ACT inhaler, Inhale 1 puff into the lungs 2 (two) times daily., Disp: 1 Inhaler, Rfl: 5 .  Calcium Carbonate-Vitamin D (CALTRATE 600+D) 600-400 MG-UNIT per tablet, Take 1 tablet by mouth 2 (two) times daily.  , Disp: , Rfl:  .  levothyroxine (SYNTHROID, LEVOTHROID) 75 MCG tablet, TAKE 1 TABLET(75 MCG) BY MOUTH DAILY., Disp: 90 tablet, Rfl: 1 .  naproxen sodium  (ANAPROX) 220 MG tablet, Take 220 mg by mouth as needed., Disp: , Rfl:   EXAM:  Vitals:   05/31/17 1111  BP: 112/70  Pulse: 67  Temp: 98.3 F (36.8 C)    Body mass index is 31.41 kg/m.  GENERAL: vitals reviewed and listed above, alert, oriented, appears well hydrated and in no acute distress  HEENT: atraumatic, conjunttiva clear, no obvious abnormalities on inspection of external nose and ears, nasal membranes/op normal.  NECK: no obvious masses on inspection  LUNGS: clear to auscultation bilaterally, no wheezes, rales or rhonchi, good air movement  CV: HRRR, no peripheral edema  MS: moves all extremities without noticeable abnormality  PSYCH: pleasant and cooperative, no obvious depression or anxiety  ASSESSMENT AND PLAN:  Discussed the following assessment and plan:  Essential hypertension - Plan: Basic metabolic panel, CBC  Hypothyroidism, unspecified type - Plan: TSH  Seasonal allergic rhinitis, unspecified trigger  Mild intermittent asthma with acute exacerbation  -glad doing well -healthy lifestyle advised - see pt instructions -labs today -continue to avoid alcohol -CPE in the fall, sooner follow up as needed   Patient Instructions  BEFORE YOU LEAVE: -labs -follow up: Sept or October for yearly physical  Keep off the alcohol :)  We have ordered labs or studies at this visit. It can take up to 1-2 weeks for results and processing. IF results require follow up or explanation, we will call you with instructions. Clinically stable results will be  released to your [facility]. If you have not heard from [place] or cannot find your results in [facility] in 2 weeks please contact our office at [phone].  If you are not yet signed up for [facility], please consider signing up.   We recommend the following healthy lifestyle for LIFE: 1) Small portions. But, make sure to get regular (at least 3 per day), healthy meals and small healthy snacks if needed.  2) Eat a  healthy clean diet.   TRY TO EAT: -at least 5-7 servings of low sugar, colorful, and nutrient rich vegetables per day (not corn, potatoes or bananas.) -berries are the best choice if you wish to eat fruit (only eat small amounts if trying to reduce weight)  -lean meets (fish, white meat of chicken or [place]) -vegan proteins for some meals - beans or tofu, whole grains, nuts and seeds -Replace bad fats with good fats - good fats include: fish, nuts and seeds, canola oil, olive oil -small amounts of low fat or non fat dairy -small amounts of100 % whole grains - check the lables -drink plenty of water  AVOID: -SUGAR, sweets, anything with added sugar, corn syrup or sweeteners - must read labels as even foods advertised as "healthy" often are loaded with sugar -if you must have a sweetener, small amounts of stevia may be best -sweetened beverages and artificially sweetened beverages -simple starches (rice, bread, potatoes, pasta, chips, etc - small amounts of 100% whole grains are ok) -red meat, pork, butter -fried foods, fast food, processed food, excessive dairy, eggs and coconut.  3)Get at least 150 minutes of sweaty aerobic exercise per week.  4)Reduce stress - consider counseling, meditation and relaxation to balance other aspects of your life.          Lucretia Kern, DO

## 2017-05-31 ENCOUNTER — Encounter: Payer: Self-pay | Admitting: Family Medicine

## 2017-05-31 ENCOUNTER — Ambulatory Visit: Payer: BLUE CROSS/BLUE SHIELD | Admitting: Family Medicine

## 2017-05-31 VITALS — BP 112/70 | HR 67 | Temp 98.3°F | Ht 66.0 in | Wt 194.6 lb

## 2017-05-31 DIAGNOSIS — E039 Hypothyroidism, unspecified: Secondary | ICD-10-CM | POA: Diagnosis not present

## 2017-05-31 DIAGNOSIS — J4521 Mild intermittent asthma with (acute) exacerbation: Secondary | ICD-10-CM | POA: Diagnosis not present

## 2017-05-31 DIAGNOSIS — J302 Other seasonal allergic rhinitis: Secondary | ICD-10-CM

## 2017-05-31 DIAGNOSIS — I10 Essential (primary) hypertension: Secondary | ICD-10-CM

## 2017-05-31 LAB — BASIC METABOLIC PANEL
BUN: 21 mg/dL (ref 6–23)
CALCIUM: 9.8 mg/dL (ref 8.4–10.5)
CHLORIDE: 102 meq/L (ref 96–112)
CO2: 28 meq/L (ref 19–32)
Creatinine, Ser: 0.85 mg/dL (ref 0.40–1.20)
GFR: 71.87 mL/min (ref >60.00)
Glucose, Bld: 89 mg/dL (ref 70–99)
Potassium: 4.6 mEq/L (ref 3.5–5.1)
SODIUM: 140 meq/L (ref 135–145)

## 2017-05-31 LAB — CBC
HCT: 42.6 % (ref 36.0–46.0)
Hemoglobin: 14.1 g/dL (ref 12.0–15.0)
MCHC: 33.2 g/dL (ref 30.0–36.0)
MCV: 90.4 fl (ref 78.0–100.0)
PLATELETS: 207 10*3/uL (ref 150.0–400.0)
RBC: 4.71 Mil/uL (ref 3.87–5.11)
RDW: 13.8 % (ref 11.5–15.5)
WBC: 4.2 10*3/uL (ref 4.0–10.5)

## 2017-05-31 LAB — TSH: TSH: 2.71 u[IU]/mL (ref 0.35–4.50)

## 2017-05-31 NOTE — Patient Instructions (Signed)
BEFORE YOU LEAVE: -labs -follow up: Sept or October for yearly physical  Keep off the alcohol :)  We have ordered labs or studies at this visit. It can take up to 1-2 weeks for results and processing. IF results require follow up or explanation, we will call you with instructions. Clinically stable results will be released to your [facility]. If you have not heard from [place] or cannot find your results in [facility] in 2 weeks please contact our office at [phone].  If you are not yet signed up for [facility], please consider signing up.   We recommend the following healthy lifestyle for LIFE: 1) Small portions. But, make sure to get regular (at least 3 per day), healthy meals and small healthy snacks if needed.  2) Eat a healthy clean diet.   TRY TO EAT: -at least 5-7 servings of low sugar, colorful, and nutrient rich vegetables per day (not corn, potatoes or bananas.) -berries are the best choice if you wish to eat fruit (only eat small amounts if trying to reduce weight)  -lean meets (fish, white meat of chicken or [place]) -vegan proteins for some meals - beans or tofu, whole grains, nuts and seeds -Replace bad fats with good fats - good fats include: fish, nuts and seeds, canola oil, olive oil -small amounts of low fat or non fat dairy -small amounts of100 % whole grains - check the lables -drink plenty of water  AVOID: -SUGAR, sweets, anything with added sugar, corn syrup or sweeteners - must read labels as even foods advertised as "healthy" often are loaded with sugar -if you must have a sweetener, small amounts of stevia may be best -sweetened beverages and artificially sweetened beverages -simple starches (rice, bread, potatoes, pasta, chips, etc - small amounts of 100% whole grains are ok) -red meat, pork, butter -fried foods, fast food, processed food, excessive dairy, eggs and coconut.  3)Get at least 150 minutes of sweaty aerobic exercise per week.  4)Reduce stress - consider  counseling, meditation and relaxation to balance other aspects of your life.

## 2017-06-01 ENCOUNTER — Telehealth: Payer: Self-pay | Admitting: Family Medicine

## 2017-06-01 ENCOUNTER — Other Ambulatory Visit: Payer: Self-pay

## 2017-06-01 DIAGNOSIS — D485 Neoplasm of uncertain behavior of skin: Secondary | ICD-10-CM | POA: Diagnosis not present

## 2017-06-01 DIAGNOSIS — D225 Melanocytic nevi of trunk: Secondary | ICD-10-CM | POA: Diagnosis not present

## 2017-06-01 DIAGNOSIS — Z85828 Personal history of other malignant neoplasm of skin: Secondary | ICD-10-CM | POA: Diagnosis not present

## 2017-06-01 DIAGNOSIS — L821 Other seborrheic keratosis: Secondary | ICD-10-CM | POA: Diagnosis not present

## 2017-06-01 DIAGNOSIS — L570 Actinic keratosis: Secondary | ICD-10-CM | POA: Diagnosis not present

## 2017-06-01 DIAGNOSIS — D2271 Melanocytic nevi of right lower limb, including hip: Secondary | ICD-10-CM | POA: Diagnosis not present

## 2017-06-01 MED ORDER — LEVOTHYROXINE SODIUM 75 MCG PO TABS: ORAL_TABLET | ORAL | 1 refills | Status: DC

## 2017-06-01 NOTE — Telephone Encounter (Signed)
Copied from [place] [id number]. Topic: Inquiry >> Jun 01, 2017  7:33 AM Pricilla Handler wrote: Reason for CRM: Patient called requesting a refill of Levothyroxine (SYNTHROID, LEVOTHROID) 75 MCG tablet. Patient is completely out of this medication. Patient states that she is going out of town tomorrow, and must have the medication refilled today. Patient's preferred pharmacy is [facility] [phone] - [place], [place] - [address] [place] [address]   Phone: [phone] Fax: [phone].        Thank You!!!

## 2017-06-01 NOTE — Telephone Encounter (Signed)
Copied from [place] [id number]. Topic: Inquiry >> Jun 01, 2017  7:33 AM Pricilla Handler wrote: Reason for CRM: Patient called requesting a refill of Levothyroxine (SYNTHROID, LEVOTHROID) 75 MCG tablet. Patient is completely out of this medication. Patient states that she is going out of town tomorrow, and must have the medication refilled today. Patient's preferred pharmacy is [facility] [phone] - [place], [place] - [address] AT [address] [place] [address]   Phone: [phone] Fax: [phone].        Thank You!!!

## 2017-06-01 NOTE — Telephone Encounter (Signed)
I left a detailed message stating the Rx was sent to her pharmacy prior to this phone call and to let us know if she has any problems.

## 2017-08-31 ENCOUNTER — Other Ambulatory Visit: Payer: Self-pay | Admitting: Family Medicine

## 2017-08-31 NOTE — Telephone Encounter (Signed)
Copied from [place] [id number]. Topic: Quick Communication - Rx Refill/Question >> Aug 31, 2017  2:54 PM Oliver Pila B wrote: Pt asked for the nurse of pcp to call her today to let her know her Rx is ready today  Medication: amLODipine (NORVASC) 5 MG tablet [[id number]]   Has the patient contacted their pharmacy? Yes.   (Agent: If no, request that the patient contact the pharmacy for the refill.) (Agent: If yes, when and what did the pharmacy advise?)  Preferred Pharmacy (with phone number or street name): [facility] [place] (phone) [phone]  Agent: Please be advised that RX refills may take up to 3 business days. We ask that you follow-up with your pharmacy.

## 2017-08-31 NOTE — Telephone Encounter (Signed)
I called the pt and left a detailed message on her cell number the Rx was sent to her pharmacy.

## 2017-09-11 DIAGNOSIS — D2271 Melanocytic nevi of right lower limb, including hip: Secondary | ICD-10-CM | POA: Diagnosis not present

## 2017-09-11 DIAGNOSIS — D485 Neoplasm of uncertain behavior of skin: Secondary | ICD-10-CM | POA: Diagnosis not present

## 2017-10-01 ENCOUNTER — Other Ambulatory Visit: Payer: Self-pay | Admitting: Family Medicine

## 2017-10-06 ENCOUNTER — Other Ambulatory Visit: Payer: Self-pay | Admitting: Family Medicine

## 2017-10-06 ENCOUNTER — Ambulatory Visit: Payer: BLUE CROSS/BLUE SHIELD | Admitting: Family Medicine

## 2017-10-06 ENCOUNTER — Encounter: Payer: Self-pay | Admitting: Family Medicine

## 2017-10-06 VITALS — BP 124/88 | HR 68 | Temp 97.7°F | Wt 188.1 lb

## 2017-10-06 DIAGNOSIS — J4521 Mild intermittent asthma with (acute) exacerbation: Secondary | ICD-10-CM | POA: Diagnosis not present

## 2017-10-06 DIAGNOSIS — J40 Bronchitis, not specified as acute or chronic: Secondary | ICD-10-CM | POA: Diagnosis not present

## 2017-10-06 DIAGNOSIS — Z9109 Other allergy status, other than to drugs and biological substances: Secondary | ICD-10-CM

## 2017-10-06 MED ORDER — PREDNISONE 20 MG PO TABS: ORAL_TABLET | ORAL | 0 refills | Status: DC

## 2017-10-06 MED ORDER — DOXYCYCLINE HYCLATE 100 MG PO CAPS: 100.0000 mg | ORAL_CAPSULE | Freq: Two times a day (BID) | ORAL | 0 refills | Status: DC

## 2017-10-06 NOTE — Patient Instructions (Signed)
Good to see you today- here is our plan to get you better  Increase qvar to 2 puffs twice a day for a couple of weeks Albuterol for 6 days Doxycycline antibiotic twice a day for 10 days Continue albuterol as needed  Let me know if not feeling better soon!

## 2017-10-06 NOTE — Progress Notes (Signed)
[place] at [facility] [address] [place], [place] [address] [phone]  Date:  10/06/2017   Name:  Jasmin Small   DOB:  [birth year]   MRN:  [id number]  PCP:  Lucretia Kern, DO    Chief Complaint: Sinusitis (PND, congestion and ashtma flare with these issues.  cough is dry.  in the last couple days this has gotten worse. )   History of Present Illness:  Jasmin Small is a 62 y.o. very pleasant female patient who presents with the following:  Pt of Dr. Blima Singer She has history of HTN, hypothyroidism, asthma and allergies   Albuterol, qvar at baseline  Her sx have been worse over the last week or so She is reaching for her albuterol several times a day recently and may have to use it at night She is wheezing at night No fever She is a bit tired but not flu like  She nose is stuffy   Patient Active Problem List   Diagnosis Date Noted  . Essential hypertension 09/21/2016  . Hypothyroidism 05/01/2014  . Allergic rhinitis 11/07/2006  . Asthma 11/07/2006    Past Medical History:  Diagnosis Date  . Actinic keratosis 01/17/2008  . ALLERGIC RHINITIS 11/07/2006  . ASTHMA 11/07/2006  . COLONIC POLYPS, HX OF 11/07/2006  . DIVERTICULOSIS, COLON 11/07/2006  . Thyroid disease    hypothyroid    Past Surgical History:  Procedure Laterality Date  . CESAREAN SECTION      Social History   Tobacco Use  . Smoking status: Never Smoker  . Smokeless tobacco: Never Used  Substance Use Topics  . Alcohol use: Yes    Comment: wine  . Drug use: No    Family History  Problem Relation Age of Onset  . Gout Father   . Cancer Father        bladder  . Arthritis Unknown   . Celiac disease Mother        sister    No Known Allergies  Medication list has been reviewed and updated.  Current Outpatient Medications on File Prior to Visit  Medication Sig Dispense Refill  . albuterol (VENTOLIN HFA) 108 (90 Base) MCG/ACT inhaler Inhale 1-2  puffs into the lungs every 4 (four) hours as needed for wheezing or shortness of breath. 1 Inhaler 1  . amLODipine (NORVASC) 5 MG tablet TAKE 1 TABLET BY MOUTH EVERY DAY 30 tablet 5  . beclomethasone (QVAR REDIHALER) 80 MCG/ACT inhaler Inhale 1 puff into the lungs 2 (two) times daily. 1 Inhaler 5  . Calcium Carbonate-Vitamin D (CALTRATE 600+D) 600-400 MG-UNIT per tablet Take 1 tablet by mouth 2 (two) times daily.      Marland Kitchen levothyroxine (SYNTHROID, LEVOTHROID) 75 MCG tablet TAKE 1 TABLET(75 MCG) BY MOUTH DAILY. 90 tablet 1  . naproxen sodium (ANAPROX) 220 MG tablet Take 220 mg by mouth as needed.     No current facility-administered medications on file prior to visit.     Review of Systems:  As per HPI- otherwise negative.   Physical Examination: Vitals:   10/06/17 1203  BP: 124/88  Pulse: 68  Temp: 97.7 F (36.5 C)  SpO2: 96%   Vitals:   10/06/17 1203  Weight: 188 lb 2 oz (85.3 kg)   Body mass index is 30.36 kg/m. Ideal Body Weight:    GEN: WDWN, NAD, Non-toxic, A & O x 3, obese, looks well  HEENT: Atraumatic, Normocephalic. Neck supple. No  masses, No LAD.  Bilateral TM wnl, oropharynx normal.  PEERL,EOMI.   Ears and Nose: No external deformity.   CV: RRR, No M/G/R. No JVD. No thrill. No extra heart sounds. PULM: CTA B, no wheezes, crackles, rhonchi. No retractions. No resp. distress. No accessory muscle use. ABD: S, NT, ND, +BS. No rebound. No HSM. EXTR: No c/c/e NEURO Normal gait.  PSYCH: Normally interactive. Conversant. Not depressed or anxious appearing.  Calm demeanor.  Not wheezing at this time   Assessment and Plan: Bronchitis - Plan: predniSONE (DELTASONE) 20 MG tablet  Mild intermittent asthma with acute exacerbation - Plan: doxycycline (VIBRAMYCIN) 100 MG capsule  Environmental allergies - Plan: Ambulatory referral to Allergy  History of asthma with current exacerbation/ bronchitis Continue albuterol Prednisone rx, doxycycline Increase dose of qvar  temporarily Referral to allergist   Signed Lamar Blinks, MD

## 2017-10-09 DIAGNOSIS — D225 Melanocytic nevi of trunk: Secondary | ICD-10-CM | POA: Diagnosis not present

## 2017-10-09 DIAGNOSIS — Z85828 Personal history of other malignant neoplasm of skin: Secondary | ICD-10-CM | POA: Diagnosis not present

## 2017-10-09 DIAGNOSIS — Z86018 Personal history of other benign neoplasm: Secondary | ICD-10-CM | POA: Diagnosis not present

## 2017-10-09 DIAGNOSIS — L719 Rosacea, unspecified: Secondary | ICD-10-CM | POA: Diagnosis not present

## 2017-10-09 DIAGNOSIS — Z23 Encounter for immunization: Secondary | ICD-10-CM | POA: Diagnosis not present

## 2017-10-30 NOTE — Progress Notes (Addendum)
62 y.o. [id number] Married White or Caucasian Not Hispanic or Latino female here for annual exam.  No vaginal bleeding. Sexually active, no pain with lubrication.  Goes back and forth from here to [place].  She has cut out carbs to help with weight loss. Now if she eats bread she gets bloated.  Normally has a BM qd. No urinary c/o.     Patient's last menstrual period was 01/03/2007 (approximate).          Sexually active: Yes.    The current method of family planning is post menopausal status.    Exercising: Yes.    walking, hiking Smoker:  no  Health Maintenance: Pap:12/01/14, Negative with neg HR HPV Abnormal pap smear: No MMG: 05/09/2017 Birads 1 negative Colonoscopy:Cologard 05/09/2017 negative BMD: 2011 WNL TDaP: 01/19/2012   reports that she has never smoked. She has never used smokeless tobacco. She reports that she drinks about 5.0 standard drinks of alcohol per week. She reports that she does not use drugs. She is self employed, works in Mining engineer. 2 daughters, one in [place] one in [place]. The one in [place] just got married. No grandchildren  Past Medical History:  Diagnosis Date  . Actinic keratosis 01/17/2008  . ALLERGIC RHINITIS 11/07/2006  . ASTHMA 11/07/2006  . COLONIC POLYPS, HX OF 11/07/2006  . DIVERTICULOSIS, COLON 11/07/2006  . Hypertension   . Thyroid disease    hypothyroid    Past Surgical History:  Procedure Laterality Date  . CESAREAN SECTION      Current Outpatient Medications  Medication Sig Dispense Refill  . albuterol (VENTOLIN HFA) 108 (90 Base) MCG/ACT inhaler Inhale 1-2 puffs into the lungs every 4 (four) hours as needed for wheezing or shortness of breath. 1 Inhaler 1  . amLODipine (NORVASC) 5 MG tablet TAKE 1 TABLET BY MOUTH EVERY DAY 30 tablet 5  . Calcium Carbonate-Vitamin D (CALTRATE 600+D) 600-400 MG-UNIT per tablet Take 1 tablet by mouth 2 (two) times daily.      Marland Kitchen levothyroxine (SYNTHROID, LEVOTHROID) 75 MCG tablet TAKE 1 TABLET(75 MCG) BY MOUTH  DAILY. 90 tablet 1  . naproxen sodium (ANAPROX) 220 MG tablet Take 220 mg by mouth as needed.    Marland Kitchen QVAR REDIHALER 80 MCG/ACT inhaler INHALE 1 PUFF BY MOUTH INTO THE LUNGS TWICE DAILY 10.6 g 0   No current facility-administered medications for this visit.     Family History  Problem Relation Age of Onset  . Gout Father   . Cancer Father        bladder  . Arthritis Unknown   . Celiac disease Mother        sister    Review of Systems  Constitutional:       Weight loss  HENT: Positive for sinus pain.   Eyes: Negative.   Respiratory: Negative.   Cardiovascular: Negative.   Gastrointestinal: Negative.   Endocrine: Negative.   Genitourinary:       Vaginal dryness  Musculoskeletal: Negative.   Skin: Negative.   Allergic/Immunologic: Negative.   Neurological: Negative.   Hematological: Negative.   Psychiatric/Behavioral: Negative.     Exam:   BP 122/82 (BP Location: Left Arm, Patient Position: Sitting, Cuff Size: Normal)   Pulse 64   Ht 5' 5.35" (1.66 m)   Wt 187 lb 9.6 oz (85.1 kg)   LMP 01/03/2007 (Approximate)   BMI 30.88 kg/m   Weight change: @WEIGHTCHANGE @ Height:   Height: 5' 5.35" (166 cm)  Ht Readings from Last 3 Encounters:  10/31/17 5' 5.35" (1.66 m)  05/31/17 5\' 6"  (1.676 m)  04/10/17 5\' 6"  (1.676 m)    General appearance: alert, cooperative and appears stated age Head: Normocephalic, without obvious abnormality, atraumatic Neck: no adenopathy, supple, symmetrical, trachea midline and thyroid normal to inspection and palpation Lungs: clear to auscultation bilaterally Cardiovascular: regular rate and rhythm Breasts: normal appearance, no masses or tenderness Abdomen: soft, non-tender; non distended,  no masses,  no organomegaly Extremities: extremities normal, atraumatic, no cyanosis or edema Skin: Skin color, texture, turgor normal. No rashes or lesions Lymph nodes: Cervical, supraclavicular, and axillary nodes normal. No abnormal inguinal nodes  palpated Neurologic: Grossly normal   Pelvic: External genitalia:  no lesions              Urethra:  normal appearing urethra with no masses, tenderness or lesions              Bartholins and Skenes: normal                 Vagina: mildly atrophic appearing vagina with normal color and discharge, no lesions              Cervix: no lesions               Bimanual Exam:  Uterus:  normal size, contour, position, consistency, mobility, non-tender              Adnexa: no mass, fullness, tenderness               Rectovaginal: Confirms               Anus:  normal sphincter tone, no lesions  Chaperone was present for exam.  A:  Well Woman with normal exam  P:   Pap with hpv  Discussed breast self exam  Discussed calcium and vit D intake  Labs with primary  DEXA in 2022  Cologuard UTD  Mammogram in 5/20

## 2017-10-31 ENCOUNTER — Encounter: Payer: Self-pay | Admitting: Obstetrics and Gynecology

## 2017-10-31 ENCOUNTER — Other Ambulatory Visit: Payer: Self-pay

## 2017-10-31 ENCOUNTER — Ambulatory Visit (INDEPENDENT_AMBULATORY_CARE_PROVIDER_SITE_OTHER): Payer: BLUE CROSS/BLUE SHIELD | Admitting: Obstetrics and Gynecology

## 2017-10-31 VITALS — BP 122/82 | HR 64 | Ht 65.35 in | Wt 187.6 lb

## 2017-10-31 DIAGNOSIS — Z01419 Encounter for gynecological examination (general) (routine) without abnormal findings: Secondary | ICD-10-CM

## 2017-10-31 DIAGNOSIS — Z124 Encounter for screening for malignant neoplasm of cervix: Secondary | ICD-10-CM

## 2017-10-31 NOTE — Patient Instructions (Signed)
EXERCISE AND DIET:  We recommended that you start or continue a regular exercise program for good health. Regular exercise means any activity that makes your heart beat faster and makes you sweat.  We recommend exercising at least 30 minutes per day at least 3 days a week, preferably 4 or 5.  We also recommend a diet low in fat and sugar.  Inactivity, poor dietary choices and obesity can cause diabetes, heart attack, stroke, and kidney damage, among others.    ALCOHOL AND SMOKING:  Women should limit their alcohol intake to no more than 7 drinks/beers/glasses of wine (combined, not each!) per week. Moderation of alcohol intake to this level decreases your risk of breast cancer and liver damage. And of course, no recreational drugs are part of a healthy lifestyle.  And absolutely no smoking or even second hand smoke. Most people know smoking can cause heart and lung diseases, but did you know it also contributes to weakening of your bones? Aging of your skin?  Yellowing of your teeth and nails?  CALCIUM AND VITAMIN D:  Adequate intake of calcium and Vitamin D are recommended.  The recommendations for exact amounts of these supplements seem to change often, but generally speaking 1,200 mg of calcium (between diet and supplements) and 800 units of Vitamin D per day seems prudent. Certain women may benefit from higher intake of Vitamin D.  If you are among these women, your doctor will have told you during your visit.    PAP SMEARS:  Pap smears, to check for cervical cancer or precancers,  have traditionally been done yearly, although recent scientific advances have shown that most women can have pap smears less often.  However, every woman still should have a physical exam from her gynecologist every year. It will include a breast check, inspection of the vulva and vagina to check for abnormal growths or skin changes, a visual exam of the cervix, and then an exam to evaluate the size and shape of the uterus and  ovaries.  And after 63 years of age, a rectal exam is indicated to check for rectal cancers. We will also provide age appropriate advice regarding health maintenance, like when you should have certain vaccines, screening for sexually transmitted diseases, bone density testing, colonoscopy, mammograms, etc.   MAMMOGRAMS:  All women over 40 years old should have a yearly mammogram. Many facilities now offer a "3D" mammogram, which may cost around $50 extra out of pocket. If possible,  we recommend you accept the option to have the 3D mammogram performed.  It both reduces the number of women who will be called back for extra views which then turn out to be normal, and it is better than the routine mammogram at detecting truly abnormal areas.    COLONOSCOPY:  Colonoscopy to screen for colon cancer is recommended for all women at age 50.  We know, you hate the idea of the prep.  We agree, BUT, having colon cancer and not knowing it is worse!!  Colon cancer so often starts as a polyp that can be seen and removed at colonscopy, which can quite literally save your life!  And if your first colonoscopy is normal and you have no family history of colon cancer, most women don't have to have it again for 10 years.  Once every ten years, you can do something that may end up saving your life, right?  We will be happy to help you get it scheduled when you are ready.    Be sure to check your insurance coverage so you understand how much it will cost.  It may be covered as a preventative service at no cost, but you should check your particular policy.      Breast Self-Awareness Breast self-awareness means being familiar with how your breasts look and feel. It involves checking your breasts regularly and reporting any changes to your health care provider. Practicing breast self-awareness is important. A change in your breasts can be a sign of a serious medical problem. Being familiar with how your breasts look and feel allows  you to find any problems early, when treatment is more likely to be successful. All women should practice breast self-awareness, including women who have had breast implants. How to do a breast self-exam One way to learn what is normal for your breasts and whether your breasts are changing is to do a breast self-exam. To do a breast self-exam: Look for Changes  1. Remove all the clothing above your waist. 2. Stand in front of a mirror in a room with good lighting. 3. Put your hands on your hips. 4. Push your hands firmly downward. 5. Compare your breasts in the mirror. Look for differences between them (asymmetry), such as: ? Differences in shape. ? Differences in size. ? Puckers, dips, and bumps in one breast and not the other. 6. Look at each breast for changes in your skin, such as: ? Redness. ? Scaly areas. 7. Look for changes in your nipples, such as: ? Discharge. ? Bleeding. ? Dimpling. ? Redness. ? A change in position. Feel for Changes  Carefully feel your breasts for lumps and changes. It is best to do this while lying on your back on the floor and again while sitting or standing in the shower or tub with soapy water on your skin. Feel each breast in the following way:  Place the arm on the side of the breast you are examining above your head.  Feel your breast with the other hand.  Start in the nipple area and make  inch (2 cm) overlapping circles to feel your breast. Use the pads of your three middle fingers to do this. Apply light pressure, then medium pressure, then firm pressure. The light pressure will allow you to feel the tissue closest to the skin. The medium pressure will allow you to feel the tissue that is a little deeper. The firm pressure will allow you to feel the tissue close to the ribs.  Continue the overlapping circles, moving downward over the breast until you feel your ribs below your breast.  Move one finger-width toward the center of the body.  Continue to use the  inch (2 cm) overlapping circles to feel your breast as you move slowly up toward your collarbone.  Continue the up and down exam using all three pressures until you reach your armpit.  Write Down What You Find  Write down what is normal for each breast and any changes that you find. Keep a written record with breast changes or normal findings for each breast. By writing this information down, you do not need to depend only on memory for size, tenderness, or location. Write down where you are in your menstrual cycle, if you are still menstruating. If you are having trouble noticing differences in your breasts, do not get discouraged. With time you will become more familiar with the variations in your breasts and more comfortable with the exam. How often should I examine my breasts? Examine   your breasts every month. If you are breastfeeding, the best time to examine your breasts is after a feeding or after using a breast pump. If you menstruate, the best time to examine your breasts is 5-7 days after your period is over. During your period, your breasts are lumpier, and it may be more difficult to notice changes. When should I see my health care provider? See your health care provider if you notice:  A change in shape or size of your breasts or nipples.  A change in the skin of your breast or nipples, such as a reddened or scaly area.  Unusual discharge from your nipples.  A lump or thick area that was not there before.  Pain in your breasts.  Anything that concerns you.  This information is not intended to replace advice given to you by your health care provider. Make sure you discuss any questions you have with your health care provider. Document Released: 12/19/2004 Document Revised: 05/27/2015 Document Reviewed: 11/08/2014 Elsevier Interactive Patient Education  2018 Elsevier Inc.  

## 2017-11-01 ENCOUNTER — Other Ambulatory Visit (HOSPITAL_COMMUNITY)
Admission: RE | Admit: 2017-11-01 | Discharge: 2017-11-01 | Disposition: A | Discharge status: Home / Self Care | Payer: BLUE CROSS/BLUE SHIELD | Source: Ambulatory Visit | Attending: Obstetrics and Gynecology | Admitting: Obstetrics and Gynecology

## 2017-11-01 DIAGNOSIS — Z124 Encounter for screening for malignant neoplasm of cervix: Secondary | ICD-10-CM | POA: Insufficient documentation

## 2017-11-01 NOTE — Addendum Note (Signed)
Addended by: Dorothy Spark on: 11/01/2017 12:06 PM   Modules accepted: Orders

## 2017-11-05 LAB — CYTOLOGY - PAP
DIAGNOSIS: NEGATIVE
HPV: NOT DETECTED

## 2017-11-19 ENCOUNTER — Ambulatory Visit: Payer: BLUE CROSS/BLUE SHIELD | Admitting: Allergy and Immunology

## 2017-11-19 ENCOUNTER — Encounter: Payer: Self-pay | Admitting: Allergy and Immunology

## 2017-11-19 VITALS — BP 124/72 | HR 67 | Temp 97.9°F | Resp 20 | Ht 65.2 in | Wt 193.2 lb

## 2017-11-19 DIAGNOSIS — J3089 Other allergic rhinitis: Secondary | ICD-10-CM | POA: Diagnosis not present

## 2017-11-19 DIAGNOSIS — L299 Pruritus, unspecified: Secondary | ICD-10-CM | POA: Diagnosis not present

## 2017-11-19 DIAGNOSIS — J4540 Moderate persistent asthma, uncomplicated: Secondary | ICD-10-CM

## 2017-11-19 MED ORDER — FLUTICASONE PROPIONATE HFA 110 MCG/ACT IN AERO: 2.0000 | INHALATION_SPRAY | Freq: Two times a day (BID) | RESPIRATORY_TRACT | 5 refills | Status: DC

## 2017-11-19 MED ORDER — MONTELUKAST SODIUM 10 MG PO TABS: 10.0000 mg | ORAL_TABLET | Freq: Every day | ORAL | 5 refills | Status: DC

## 2017-11-19 MED ORDER — LEVOCETIRIZINE DIHYDROCHLORIDE 5 MG PO TABS: 5.0000 mg | ORAL_TABLET | Freq: Every day | ORAL | 5 refills | Status: DC | PRN

## 2017-11-19 MED ORDER — AZELASTINE HCL 0.15 % NA SOLN: 1.0000 | Freq: Every day | NASAL | 5 refills | Status: DC | PRN

## 2017-11-19 NOTE — Assessment & Plan Note (Addendum)
   A prescription has been provided for levocetirizine, 5 mg daily as needed.  To avoid diminishing benefit with daily use (tachyphylaxis) of second generation antihistamine, consider alternating every few months between fexofenadine (Allegra) and levocetirizine (Xyzal).  Should symptoms progress or change in character, a journal is to be kept recording any foods eaten, beverages consumed, and medications taken within a 6 hour time period prior to the onset of symptoms, as well as record activities being performed, and environmental conditions. For any symptoms concerning for anaphylaxis, 911 is to be called immediately.  If this problem progresses or changes in character, we will investigate further.

## 2017-11-19 NOTE — Patient Instructions (Addendum)
Moderate persistent asthma Todays spirometry results, assessed while asymptomatic, suggest under-perception of bronchoconstriction.  A prescription has been provided for Flovent (fluticasone) 110 g,  2 inhalations twice a day. To maximize pulmonary deposition, a spacer has been provided along with instructions for its proper administration with an HFA inhaler.  During respiratory tract infections or asthma flares, increase Flovent 110g to 3 inhalations 3 times per day until symptoms have returned to baseline.  A prescription has been provided for montelukast 10 mg daily at bedtime.  Continue albuterol HFA, 1 to 2 inhalations every 4-6 hours if needed.  Subjective and objective measures of pulmonary function will be followed and the treatment plan will be adjusted accordingly.  Allergic rhinitis with a nonallergic component  Aeroallergen avoidance measures have been discussed and provided in written form.  A prescription has been provided for azelastine nasal spray, one spray per nostril 1-2 times daily as needed. Proper nasal spray technique has been discussed and demonstrated.  Nasal saline spray (i.e., Simply Saline) or nasal saline lavage (i.e., NeilMed) is recommended as needed and prior to medicated nasal sprays.  Montelukast has been prescribed (as above).  For thick post nasal drainage, add guaifenesin [phone] mg (Mucinex)  twice daily as needed with adequate hydration as discussed.  Pruritus  A prescription has been provided for levocetirizine, 5 mg daily as needed.  To avoid diminishing benefit with daily use (tachyphylaxis) of second generation antihistamine, consider alternating every few months between fexofenadine (Allegra) and levocetirizine (Xyzal).  Should symptoms progress or change in character, a journal is to be kept recording any foods eaten, beverages consumed, and medications taken within a 6 hour time period prior to the onset of symptoms, as well as record  activities being performed, and environmental conditions. For any symptoms concerning for anaphylaxis, 911 is to be called immediately.  If this problem progresses or changes in character, we will investigate further.   Return in about 2 months (around 01/19/2018), or if symptoms worsen or fail to improve.  Control of Mold Allergen  Mold and fungi can grow on a variety of surfaces provided certain temperature and moisture conditions exist.  Outdoor molds grow on plants, decaying vegetation and soil.  The major outdoor mold, Alternaria and Cladosporium, are found in very high numbers during hot and dry conditions.  Generally, a late Summer - Fall peak is seen for common outdoor fungal spores.  Rain will temporarily lower outdoor mold spore count, but counts rise rapidly when the rainy period ends.  The most important indoor molds are Aspergillus and Penicillium.  Dark, humid and poorly ventilated basements are ideal sites for mold growth.  The next most common sites of mold growth are the bathroom and the kitchen.  Outdoor [employer] 1. Use air conditioning and keep windows closed 2. Avoid exposure to decaying vegetation. 3. Avoid leaf raking. 4. Avoid grain handling. 5. Consider wearing a face mask if working in moldy areas.  Indoor Mold Control 1. Maintain humidity below 50%. 2. Clean washable surfaces with 5% bleach solution. 3. Remove sources e.g. Contaminated carpets.

## 2017-11-19 NOTE — Assessment & Plan Note (Addendum)
   Aeroallergen avoidance measures have been discussed and provided in written form.  A prescription has been provided for azelastine nasal spray, one spray per nostril 1-2 times daily as needed. Proper nasal spray technique has been discussed and demonstrated.  Nasal saline spray (i.e., Simply Saline) or nasal saline lavage (i.e., NeilMed) is recommended as needed and prior to medicated nasal sprays.  Montelukast has been prescribed (as above).  For thick post nasal drainage, add guaifenesin [phone] mg (Mucinex)  twice daily as needed with adequate hydration as discussed.

## 2017-11-19 NOTE — Progress Notes (Signed)
New Patient Note  RE: Jasmin Small MRN: [id number] DOB: June 11, 1955 Date of Office Visit: 11/19/2017  Referring provider: Darreld Mclean, MD Primary care provider: Lucretia Kern, DO  Chief Complaint: Wheezing; Sinus Problem; and Pruritus   History of present illness: Jasmin Small is a 62 y.o. female seen today in consultation requested by Lamar Blinks, MD.  She reports that she lived in [place] from 2005-2007.  In 2007 she began to experience episodes of coughing and wheezing.  She believes that exposure to pollution in [place] was to blame for the onset of these lower respiratory symptoms.  However, over the past 2 years she has been experiencing more frequent coughing and wheezing.  She states that over the past 2 or 3 years she has required prednisone in the springtime and in the fall due to bronchitis.  Albuterol provides "immediate" relief.   She experiences nasal congestion, rhinorrhea, sneezing, thick postnasal drainage, throat clearing, and occasional ocular pruritus.  These symptoms occur year around but are more frequent and severe during early spring and in the fall.  She attempts to control these symptoms with cetirizine daily. She has found that if she stops taking cetirizine she experiences "terrible itching" on different parts of her body, typically the scalp.  She denies concomitant urticaria, angioedema, cardiopulmonary symptoms, or GI symptoms.  Assessment and plan: Moderate persistent asthma Todays spirometry results, assessed while asymptomatic, suggest under-perception of bronchoconstriction.  A prescription has been provided for Flovent (fluticasone) 110 g,  2 inhalations twice a day. To maximize pulmonary deposition, a spacer has been provided along with instructions for its proper administration with an HFA inhaler.  During respiratory tract infections or asthma flares, increase Flovent 110g to 3 inhalations 3 times per day until symptoms have returned to  baseline.  A prescription has been provided for montelukast 10 mg daily at bedtime.  Continue albuterol HFA, 1 to 2 inhalations every 4-6 hours if needed.  Subjective and objective measures of pulmonary function will be followed and the treatment plan will be adjusted accordingly.  Allergic rhinitis with a nonallergic component  Aeroallergen avoidance measures have been discussed and provided in written form.  A prescription has been provided for azelastine nasal spray, one spray per nostril 1-2 times daily as needed. Proper nasal spray technique has been discussed and demonstrated.  Nasal saline spray (i.e., Simply Saline) or nasal saline lavage (i.e., NeilMed) is recommended as needed and prior to medicated nasal sprays.  Montelukast has been prescribed (as above).  For thick post nasal drainage, add guaifenesin [phone] mg (Mucinex)  twice daily as needed with adequate hydration as discussed.  Pruritus  A prescription has been provided for levocetirizine, 5 mg daily as needed.  To avoid diminishing benefit with daily use (tachyphylaxis) of second generation antihistamine, consider alternating every few months between fexofenadine (Allegra) and levocetirizine (Xyzal).  Should symptoms progress or change in character, a journal is to be kept recording any foods eaten, beverages consumed, and medications taken within a 6 hour time period prior to the onset of symptoms, as well as record activities being performed, and environmental conditions. For any symptoms concerning for anaphylaxis, 911 is to be called immediately.  If this problem progresses or changes in character, we will investigate further.   Meds ordered this encounter  Medications  . fluticasone (FLOVENT HFA) 110 MCG/ACT inhaler    Sig: Inhale 2 puffs into the lungs 2 (two) times daily.    Dispense:  1 Inhaler  Refill:  5  . montelukast (SINGULAIR) 10 MG tablet    Sig: Take 1 tablet (10 mg total) by mouth at  bedtime.    Dispense:  30 tablet    Refill:  5  . Azelastine HCl 0.15 % SOLN    Sig: Place 1-2 sprays into both nostrils daily as needed.    Dispense:  30 mL    Refill:  5  . levocetirizine (XYZAL) 5 MG tablet    Sig: Take 1 tablet (5 mg total) by mouth daily as needed for allergies.    Dispense:  30 tablet    Refill:  5    Diagnostics: Spirometry: Prematurity reveals an FVC of 3.28 L and an FEV1 of 2.36 L with significant (350 mL, 15%) postbronchodilator improvement.  This study was performed while the patient was asymptomatic.  Please see scanned spirometry results for details. Epicutaneous testing: Negative despite a positive histamine control. Intradermal testing: Positive to major mold mix #3.    Physical examination: Blood pressure 124/72, pulse 67, temperature 97.9 F (36.6 C), temperature source Oral, resp. rate 20, height 5' 5.2" (1.656 m), weight 193 lb 3.2 oz (87.6 kg), last menstrual period 01/03/2007, SpO2 97 %.  General: Alert, interactive, in no acute distress. HEENT: TMs pearly gray, turbinates moderately edematous without discharge, post-pharynx moderately erythematous. Neck: Supple without lymphadenopathy. Lungs: Clear to auscultation without wheezing, rhonchi or rales. CV: Normal S1, S2 without murmurs. Abdomen: Nondistended, nontender. Skin: Warm and dry, without lesions or rashes. Extremities:  No clubbing, cyanosis or edema. Neuro:   Grossly intact.  Review of systems:  Review of systems negative except as noted in HPI / PMHx or noted below: Review of Systems  Constitutional: Negative.   HENT: Negative.   Eyes: Negative.   Respiratory: Negative.   Cardiovascular: Negative.   Gastrointestinal: Negative.   Genitourinary: Negative.   Musculoskeletal: Negative.   Skin: Negative.   Neurological: Negative.   Endo/Heme/Allergies: Negative.   Psychiatric/Behavioral: Negative.     Past medical history:  Past Medical History:  Diagnosis Date  . Actinic  keratosis 01/17/2008  . ALLERGIC RHINITIS 11/07/2006  . ASTHMA 11/07/2006  . Asthma   . COLONIC POLYPS, HX OF 11/07/2006  . DIVERTICULOSIS, COLON 11/07/2006  . Hypertension   . Recurrent upper respiratory infection (URI)   . Thyroid disease    hypothyroid    Past surgical history:  Past Surgical History:  Procedure Laterality Date  . CESAREAN SECTION      Family history: Family History  Problem Relation Age of Onset  . Gout Father   . Cancer Father        bladder  . Arthritis Unknown   . Celiac disease Mother        sister  . Allergic rhinitis Mother   . Allergic rhinitis Sister   . Asthma Neg Hx   . Eczema Neg Hx   . Urticaria Neg Hx     Social history: Social History   Socioeconomic History  . Marital status: Married    Spouse name: Not on file  . Number of children: Not on file  . Years of education: Not on file  . Highest education level: Not on file  Occupational History  . Not on file  Social Needs  . Financial resource strain: Not on file  . Food insecurity:    Worry: Not on file    Inability: Not on file  . Transportation needs:    Medical: Not on file  Non-medical: Not on file  Tobacco Use  . Smoking status: Never Smoker  . Smokeless tobacco: Never Used  Substance and Sexual Activity  . Alcohol use: Yes    Alcohol/week: 5.0 standard drinks    Types: 5 Glasses of wine per week    Comment: wine  . Drug use: No  . Sexual activity: Yes    Birth control/protection: Post-menopausal  Lifestyle  . Physical activity:    Days per week: Not on file    Minutes per session: Not on file  . Stress: Not on file  Relationships  . Social connections:    Talks on phone: Not on file    Gets together: Not on file    Attends religious service: Not on file    Active member of club or organization: Not on file    Attends meetings of clubs or organizations: Not on file    Relationship status: Not on file  . Intimate partner violence:    Fear of current or ex  partner: Not on file    Emotionally abused: Not on file    Physically abused: Not on file    Forced sexual activity: Not on file  Other Topics Concern  . 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  There are dogs in the home which have access to her bedroom.  There is no known mold/water damage in the home.  She is a non-smoker.  Allergies as of 11/19/2017   No Known Allergies     Medication List        Accurate as of 11/19/17  7:53 PM. Always use your most recent med list.          albuterol 108 (90 Base) MCG/ACT inhaler Commonly known as:  PROVENTIL HFA;VENTOLIN HFA Inhale 1-2 puffs into the lungs every 4 (four) hours as needed for wheezing or shortness of breath.   amLODipine 5 MG tablet Commonly known as:  NORVASC TAKE 1 TABLET BY MOUTH EVERY DAY   Azelastine HCl 0.15 % Soln Place 1-2 sprays into both nostrils daily as needed.   CALTRATE 600+D 600-400 MG-UNIT tablet Generic drug:  Calcium Carbonate-Vitamin D Take 1 tablet by mouth 2 (two) times daily.   fluticasone 110 MCG/ACT inhaler Commonly known as:  FLOVENT HFA Inhale 2 puffs into the lungs 2 (two) times daily.   levocetirizine 5 MG tablet Commonly known as:  XYZAL Take 1 tablet (5 mg total) by mouth daily as needed for allergies.   levothyroxine 75 MCG tablet Commonly known as:  SYNTHROID, LEVOTHROID TAKE 1 TABLET(75 MCG) BY MOUTH DAILY.   montelukast 10 MG tablet Commonly known as:  SINGULAIR Take 1 tablet (10 mg total) by mouth at bedtime.   naproxen sodium 220 MG tablet Commonly known as:  ALEVE Take 220 mg by mouth as  needed.   QVAR REDIHALER 80 MCG/ACT inhaler Generic drug:  beclomethasone INHALE 1 PUFF BY MOUTH INTO THE LUNGS TWICE DAILY       Known medication allergies: No Known Allergies  I appreciate the opportunity to take  part in [place] care. Please do not hesitate to contact me with questions.  Sincerely,   R. Edgar Frisk, MD

## 2017-11-19 NOTE — Assessment & Plan Note (Addendum)
Todays spirometry results, assessed while asymptomatic, suggest under-perception of bronchoconstriction.  A prescription has been provided for Flovent (fluticasone) 110 g, 2 inhalations twice a day. To maximize pulmonary deposition, a spacer has been provided along with instructions for its proper administration with an HFA inhaler.  During respiratory tract infections or asthma flares, increase Flovent 110g to 3 inhalations 3 times per day until symptoms have returned to baseline.  A prescription has been provided for montelukast 10 mg daily at bedtime.  Continue albuterol HFA, 1 to 2 inhalations every 4-6 hours if needed.  Subjective and objective measures of pulmonary function will be followed and the treatment plan will be adjusted accordingly.

## 2017-11-28 ENCOUNTER — Other Ambulatory Visit: Payer: Self-pay | Admitting: Family Medicine

## 2017-11-28 MED ORDER — LEVOTHYROXINE SODIUM 75 MCG PO TABS: ORAL_TABLET | ORAL | 3 refills | Status: DC

## 2017-11-28 NOTE — Telephone Encounter (Signed)
Copied from [place] [id number]. Topic: Quick Communication - Rx Refill/Question >> Nov 28, 2017  3:57 PM Ahmed Prima L wrote: Medication: levothyroxine (SYNTHROID, LEVOTHROID) 75 MCG tablet  Has the patient contacted their pharmacy? Yes, she is at the pharmacy and they advised her no refills and to call office. Patient is frustrated because she never gets enough refills in between her appointments.  (Agent: If no, request that the patient contact the pharmacy for the refill.) (Agent: If yes, when and what did the pharmacy advise?)  Preferred Pharmacy (with phone number or street name): [facility] [place] [place] - [address] AT [address] [id number]    Agent: Please be advised that RX refills may take up to 3 business days. We ask that you follow-up with your pharmacy.

## 2017-12-05 NOTE — Progress Notes (Signed)
HPI:  Using dictation device. Unfortunately this device frequently misinterprets words/phrases.  Jasmin Small is a pleasant 62 y.o. here for CPE per her report. See scanned documentation of details as computers were down for visit. Sees gyn and dermatologist. Also allergist.  Hypertension/Obesity: -meds: norvasc 5mg  -recommended healthy diet and regular exercise -wt 194 05/2017 -->   Hypothyroidism: -levothyroxine 75 mcg  Asthma/allergies: -qvar, alb prn -seeing allergy/asthma clinic  ROS: See pertinent positives and negatives per HPI.  Past Medical History:  Diagnosis Date  . Actinic keratosis 01/17/2008  . ALLERGIC RHINITIS 11/07/2006  . ASTHMA 11/07/2006  . Asthma   . COLONIC POLYPS, HX OF 11/07/2006  . DIVERTICULOSIS, COLON 11/07/2006  . Hypertension   . Recurrent upper respiratory infection (URI)   . Thyroid disease    hypothyroid    Past Surgical History:  Procedure Laterality Date  . CESAREAN SECTION      Family History  Problem Relation Age of Onset  . Gout Father   . Cancer Father        bladder  . Arthritis Unknown   . Celiac disease Mother        sister  . Allergic rhinitis Mother   . Allergic rhinitis Sister   . Asthma Neg Hx   . Eczema Neg Hx   . Urticaria Neg Hx     SOCIAL HX: see hpi, scanned   Current Outpatient Medications:  .  albuterol (VENTOLIN HFA) 108 (90 Base) MCG/ACT inhaler, Inhale 1-2 puffs into the lungs every 4 (four) hours as needed for wheezing or shortness of breath., Disp: 1 Inhaler, Rfl: 1 .  amLODipine (NORVASC) 5 MG tablet, TAKE 1 TABLET BY MOUTH EVERY DAY, Disp: 30 tablet, Rfl: 5 .  Azelastine HCl 0.15 % SOLN, Place 1-2 sprays into both nostrils daily as needed., Disp: 30 mL, Rfl: 5 .  Calcium Carbonate-Vitamin D (CALTRATE 600+D) 600-400 MG-UNIT per tablet, Take 1 tablet by mouth 2 (two) times daily.  , Disp: , Rfl:  .  fluticasone (FLOVENT HFA) 110 MCG/ACT inhaler, Inhale 2 puffs into the lungs 2 (two) times daily.,  Disp: 1 Inhaler, Rfl: 5 .  levocetirizine (XYZAL) 5 MG tablet, Take 1 tablet (5 mg total) by mouth daily as needed for allergies., Disp: 30 tablet, Rfl: 5 .  levothyroxine (SYNTHROID, LEVOTHROID) 75 MCG tablet, TAKE 1 TABLET(75 MCG) BY MOUTH DAILY., Disp: 90 tablet, Rfl: 3 .  montelukast (SINGULAIR) 10 MG tablet, Take 1 tablet (10 mg total) by mouth at bedtime., Disp: 30 tablet, Rfl: 5 .  naproxen sodium (ANAPROX) 220 MG tablet, Take 220 mg by mouth as needed., Disp: , Rfl:  .  QVAR REDIHALER 80 MCG/ACT inhaler, INHALE 1 PUFF BY MOUTH INTO THE LUNGS TWICE DAILY, Disp: 10.6 g, Rfl: 0  EXAM:  Vitals:   12/06/17 1036  BP: 124/80  Pulse: 62    There is no height or weight on file to calculate BMI.  Sees scanned documentation.  ASSESSMENT AND PLAN:  Discussed the following assessment and plan:  Hypothyroidism, unspecified type - Plan: TSH  Encounter for preventative adult health care examination - Plan: Basic metabolic panel, CBC with Differential/Platelet, Hemoglobin A1c, Hepatic function panel, Lipid panel, TSH  Essential hypertension - Plan: Basic metabolic panel  Sees scanned documentation. Computers down during visit.  -Patient advised to return or notify a doctor immediately if symptoms worsen or persist or new concerns arise.  There are no Patient Instructions on file for this visit.  Lucretia Kern, DO

## 2017-12-06 ENCOUNTER — Ambulatory Visit (INDEPENDENT_AMBULATORY_CARE_PROVIDER_SITE_OTHER): Payer: BLUE CROSS/BLUE SHIELD | Admitting: Family Medicine

## 2017-12-06 ENCOUNTER — Encounter: Payer: Self-pay | Admitting: Family Medicine

## 2017-12-06 VITALS — BP 124/80 | HR 62

## 2017-12-06 DIAGNOSIS — I10 Essential (primary) hypertension: Secondary | ICD-10-CM | POA: Diagnosis not present

## 2017-12-06 DIAGNOSIS — E039 Hypothyroidism, unspecified: Secondary | ICD-10-CM

## 2017-12-06 DIAGNOSIS — Z0000 Encounter for general adult medical examination without abnormal findings: Secondary | ICD-10-CM

## 2017-12-06 LAB — CBC WITH DIFFERENTIAL/PLATELET
BASOS PCT: 1 % (ref 0.0–3.0)
Basophils Absolute: 0 10*3/uL (ref 0.0–0.1)
EOS ABS: 0.2 10*3/uL (ref 0.0–0.7)
EOS PCT: 5.2 % — AB (ref 0.0–5.0)
HEMATOCRIT: 42 % (ref 36.0–46.0)
HEMOGLOBIN: 14 g/dL (ref 12.0–15.0)
Lymphocytes Relative: 23.3 % (ref 12.0–46.0)
Lymphs Abs: 0.9 10*3/uL (ref 0.7–4.0)
MCHC: 33.4 g/dL (ref 30.0–36.0)
MCV: 91.7 fl (ref 78.0–100.0)
MONOS PCT: 8 % (ref 3.0–12.0)
Monocytes Absolute: 0.3 10*3/uL (ref 0.1–1.0)
NEUTROS ABS: 2.5 10*3/uL (ref 1.4–7.7)
Neutrophils Relative %: 62.5 % (ref 43.0–77.0)
PLATELETS: 224 10*3/uL (ref 150.0–400.0)
RBC: 4.58 Mil/uL (ref 3.87–5.11)
RDW: 13.5 % (ref 11.5–15.5)
WBC: 3.9 10*3/uL — AB (ref 4.0–10.5)

## 2017-12-06 LAB — BASIC METABOLIC PANEL
BUN: 17 mg/dL (ref 6–23)
CHLORIDE: 103 meq/L (ref 96–112)
CO2: 30 meq/L (ref 19–32)
Calcium: 9.4 mg/dL (ref 8.4–10.5)
Creatinine, Ser: 0.81 mg/dL (ref 0.40–1.20)
GFR: 75.85 mL/min (ref >60.00)
Glucose, Bld: 101 mg/dL — ABNORMAL HIGH (ref 70–99)
POTASSIUM: 4.4 meq/L (ref 3.5–5.1)
Sodium: 139 mEq/L (ref 135–145)

## 2017-12-06 LAB — LIPID PANEL
CHOLESTEROL: 246 mg/dL — AB (ref 0–200)
HDL: 103.5 mg/dL (ref >39.00)
LDL CALC: 111 mg/dL — AB (ref 0–99)
NonHDL: 142.23
Total CHOL/HDL Ratio: 2
Triglycerides: 156 mg/dL — ABNORMAL HIGH (ref 0.0–149.0)
VLDL: 31.2 mg/dL (ref 0.0–40.0)

## 2017-12-06 LAB — HEPATIC FUNCTION PANEL
ALT: 17 U/L (ref 0–35)
AST: 17 U/L (ref 0–37)
Albumin: 4.4 g/dL (ref 3.5–5.2)
Alkaline Phosphatase: 61 U/L (ref 39–117)
BILIRUBIN TOTAL: 0.4 mg/dL (ref 0.2–1.2)
Bilirubin, Direct: 0.1 mg/dL (ref 0.0–0.3)
TOTAL PROTEIN: 6.5 g/dL (ref 6.0–8.3)

## 2017-12-06 LAB — TSH: TSH: 2.98 u[IU]/mL (ref 0.35–4.50)

## 2017-12-06 LAB — HEMOGLOBIN A1C: Hgb A1c MFr Bld: 5.4 % (ref 4.6–6.5)

## 2017-12-07 MED ORDER — AMLODIPINE BESYLATE 5 MG PO TABS: 5.0000 mg | ORAL_TABLET | Freq: Every day | ORAL | 3 refills | Status: DC

## 2017-12-07 NOTE — Addendum Note (Signed)
Addended by: Agnes Lawrence on: 12/07/2017 02:43 PM   Modules accepted: Orders

## 2017-12-18 ENCOUNTER — Other Ambulatory Visit: Payer: Self-pay | Admitting: Family Medicine

## 2017-12-18 MED ORDER — BECLOMETHASONE DIPROP HFA 80 MCG/ACT IN AERB: INHALATION_SPRAY | RESPIRATORY_TRACT | 0 refills | Status: DC

## 2017-12-18 NOTE — Telephone Encounter (Signed)
Copied from [place] [id number]. Topic: Quick Communication - Rx Refill/Question >> Dec 18, 2017  1:51 PM Leward Quan A wrote: Medication: QVAR REDIHALER 80 MCG/ACT inhaler   Has the patient contacted their pharmacy? Yes.   (Agent: If no, request that the patient contact the pharmacy for the refill.) (Agent: If yes, when and what did the pharmacy advise?)  Preferred Pharmacy (with phone number or street name): [id number] - [place] [place], [address] AT [address] [place] [phone] (Phone) [phone] (Fax)    Agent: Please be advised that RX refills may take up to 3 business days. We ask that you follow-up with your pharmacy.

## 2018-01-28 ENCOUNTER — Ambulatory Visit: Payer: BLUE CROSS/BLUE SHIELD | Admitting: Allergy and Immunology

## 2018-01-28 ENCOUNTER — Encounter: Payer: Self-pay | Admitting: Allergy and Immunology

## 2018-01-28 VITALS — BP 140/80 | HR 68 | Resp 20

## 2018-01-28 DIAGNOSIS — J4540 Moderate persistent asthma, uncomplicated: Secondary | ICD-10-CM

## 2018-01-28 DIAGNOSIS — J3089 Other allergic rhinitis: Secondary | ICD-10-CM

## 2018-01-28 DIAGNOSIS — J4520 Mild intermittent asthma, uncomplicated: Secondary | ICD-10-CM | POA: Diagnosis not present

## 2018-01-28 NOTE — Progress Notes (Signed)
Follow-up Note  RE: Jasmin Small MRN: [id number] DOB: 1955/05/15 Date of Office Visit: 01/28/2018  Primary care provider: Lucretia Kern, DO Referring provider: Lucretia Kern, DO  History of present illness: Jasmin Small is a 63 y.o. female with persistent asthma and mixed rhinitis presenting today for follow-up.  She was previously seen in this clinic for her initial evaluation on November 19, 2017.  She reports that the interval since her previous visit her asthma has been well controlled.  She has only required albuterol rescue on one occasion over the past 2 months.  She is currently taking Flovent 110 g, 2 inhalations via spacer device twice a day, and montelukast 10 mg daily at bedtime.  She reports that her nasal allergy symptoms have been well controlled until the past 2 days when she began to experience some nasal congestion which she suspects may be due to the rapid weather changes.  She admits that she has not been using the azelastine nasal spray recently.  Assessment and plan: Moderate persistent asthma Well-controlled, we will stepdown therapy at this time.  Decrease Flovent 110 g to 1 inhalation twice daily.  If lower respiratory symptoms progress in frequency and/or severity, the patient is to resume the previous dose.  During respiratory tract infections or asthma flares, increase Flovent 110g to 3 inhalations 2 times per day until symptoms have returned to baseline.  To maximize pulmonary deposition, a spacer has been provided along with instructions for its proper administration with an HFA inhaler.  Continue montelukast 10 mg daily at bedtime and albuterol HFA, 1 to 2 inhalations every 4-6 hours if needed.  Subjective and objective measures of pulmonary function will be followed and the treatment plan will be adjusted accordingly.  Allergic rhinitis with a nonallergic component  Continue appropriate allergen avoidance measures and montelukast 10 mg daily at  bedtime.  Add azelastine nasal spray, 1 to 2 sprays per nostril 1-2 times daily as needed.  Nasal saline spray (i.e., Simply Saline) or nasal saline lavage (i.e., NeilMed) is recommended as needed and prior to medicated nasal sprays.   Diagnostics: Spirometry:  Normal with an FEV1 of 100% predicted.  Please see scanned spirometry results for details.    Physical examination: Blood pressure 140/80, pulse 68, resp. rate 20, last menstrual period 01/03/2007, SpO2 95 %.  General: Alert, interactive, in no acute distress. HEENT: TMs pearly gray, turbinates moderately edematous without discharge, post-pharynx mildly erythematous. Neck: Supple without lymphadenopathy. Lungs: Clear to auscultation without wheezing, rhonchi or rales. CV: Normal S1, S2 without murmurs. Skin: Warm and dry, without lesions or rashes.  The following portions of the patient's history were reviewed and updated as appropriate: allergies, current medications, past family history, past medical history, past social history, past surgical history and problem list.  Allergies as of 01/28/2018   No Known Allergies     Medication List       Accurate as of January 28, 2018  4:47 PM. Always use your most recent med list.        albuterol 108 (90 Base) MCG/ACT inhaler Commonly known as:  VENTOLIN HFA Inhale 1-2 puffs into the lungs every 4 (four) hours as needed for wheezing or shortness of breath.   amLODipine 5 MG tablet Commonly known as:  NORVASC Take 1 tablet (5 mg total) by mouth daily.   Azelastine HCl 0.15 % Soln Place 1-2 sprays into both nostrils daily as needed.   CALTRATE 600+D 600-400 MG-UNIT tablet Generic drug:  Calcium Carbonate-Vitamin D Take 1 tablet by mouth 2 (two) times daily.   fluticasone 110 MCG/ACT inhaler Commonly known as:  FLOVENT HFA Inhale 2 puffs into the lungs 2 (two) times daily.   levocetirizine 5 MG tablet Commonly known as:  XYZAL Take 1 tablet (5 mg total) by mouth  daily as needed for allergies.   levothyroxine 75 MCG tablet Commonly known as:  SYNTHROID, LEVOTHROID TAKE 1 TABLET(75 MCG) BY MOUTH DAILY.   montelukast 10 MG tablet Commonly known as:  SINGULAIR Take 1 tablet (10 mg total) by mouth at bedtime.   naproxen sodium 220 MG tablet Commonly known as:  ALEVE Take 220 mg by mouth as needed.       No Known Allergies  I appreciate the opportunity to take part in [place] care. Please do not hesitate to contact me with questions.  Sincerely,   R. Edgar Frisk, MD

## 2018-01-28 NOTE — Patient Instructions (Addendum)
Moderate persistent asthma Well-controlled, we will stepdown therapy at this time.  Decrease Flovent 110 g to 1 inhalation twice daily.  If lower respiratory symptoms progress in frequency and/or severity, the patient is to resume the previous dose.  During respiratory tract infections or asthma flares, increase Flovent 110g to 3 inhalations 2 times per day until symptoms have returned to baseline.  To maximize pulmonary deposition, a spacer has been provided along with instructions for its proper administration with an HFA inhaler.  Continue montelukast 10 mg daily at bedtime and albuterol HFA, 1 to 2 inhalations every 4-6 hours if needed.  Subjective and objective measures of pulmonary function will be followed and the treatment plan will be adjusted accordingly.  Allergic rhinitis with a nonallergic component  Continue appropriate allergen avoidance measures and montelukast 10 mg daily at bedtime.  Add azelastine nasal spray, 1 to 2 sprays per nostril 1-2 times daily as needed.  Nasal saline spray (i.e., Simply Saline) or nasal saline lavage (i.e., NeilMed) is recommended as needed and prior to medicated nasal sprays.   Return in about 4 months (around 05/29/2018), or if symptoms worsen or fail to improve.

## 2018-01-28 NOTE — Assessment & Plan Note (Signed)
   Continue appropriate allergen avoidance measures and montelukast 10 mg daily at bedtime.  Add azelastine nasal spray, 1 to 2 sprays per nostril 1-2 times daily as needed.  Nasal saline spray (i.e., Simply Saline) or nasal saline lavage (i.e., NeilMed) is recommended as needed and prior to medicated nasal sprays.

## 2018-01-28 NOTE — Assessment & Plan Note (Signed)
Well-controlled, we will stepdown therapy at this time.  Decrease Flovent 110 g to 1 inhalation twice daily.  If lower respiratory symptoms progress in frequency and/or severity, the patient is to resume the previous dose.  During respiratory tract infections or asthma flares, increase Flovent 110g to 3 inhalations 2 times per day until symptoms have returned to baseline.  To maximize pulmonary deposition, a spacer has been provided along with instructions for its proper administration with an HFA inhaler.  Continue montelukast 10 mg daily at bedtime and albuterol HFA, 1 to 2 inhalations every 4-6 hours if needed.  Subjective and objective measures of pulmonary function will be followed and the treatment plan will be adjusted accordingly.

## 2018-02-06 ENCOUNTER — Ambulatory Visit: Payer: BLUE CROSS/BLUE SHIELD | Admitting: Family Medicine

## 2018-02-06 ENCOUNTER — Encounter: Payer: Self-pay | Admitting: Family Medicine

## 2018-02-06 VITALS — BP 122/64 | HR 76 | Temp 98.2°F | Ht 67.0 in | Wt 192.7 lb

## 2018-02-06 DIAGNOSIS — J0100 Acute maxillary sinusitis, unspecified: Secondary | ICD-10-CM | POA: Diagnosis not present

## 2018-02-06 MED ORDER — DOXYCYCLINE HYCLATE 100 MG PO TABS: 100.0000 mg | ORAL_TABLET | Freq: Two times a day (BID) | ORAL | 0 refills | Status: AC

## 2018-02-06 NOTE — Progress Notes (Signed)
Jasmin Small DOB: [birth year] Encounter date: 02/06/2018  This is a 63 y.o. female who presents with Chief Complaint  Patient presents with  . head congestion    x 2 weeks, drainage, no sore throat/ cough    History of present illness:  Wondering if she has a sinus infection. Has been going on for over 2 weeks.   Has seen allergist. Has asthma and had for years been on QVAR for this. Every time she got sick it seemed to just go to chest. Switched inhaler to flovent in fall. Has made a big difference using this with spacer. She was also tested for multiple allergies and reacted only to mold. Had been taking allegra every night. He had her stop before testing. Started itching when coming off allegra. Started taking xyzal and singulair. Xyzal was to help counteract itching. Continued with singulair. When she saw allergist last Monday he started nasal spray - saline, neti pot. Has upper teeth pain.   No fevers, chills. Hard to sleep because of nasal drainage. Maxillary pressure. Not getting drainage out of nose; congestion is staying more in sinuses.   Flying tomorrow.     No Known Allergies Current Meds  Medication Sig  . albuterol (VENTOLIN HFA) 108 (90 Base) MCG/ACT inhaler Inhale 1-2 puffs into the lungs every 4 (four) hours as needed for wheezing or shortness of breath.  Marland Kitchen amLODipine (NORVASC) 5 MG tablet Take 1 tablet (5 mg total) by mouth daily.  . Azelastine HCl 0.15 % SOLN Place 1-2 sprays into both nostrils daily as needed.  . Calcium Carbonate-Vitamin D (CALTRATE 600+D) 600-400 MG-UNIT per tablet Take 1 tablet by mouth 2 (two) times daily.    . fluticasone (FLOVENT HFA) 110 MCG/ACT inhaler Inhale 2 puffs into the lungs 2 (two) times daily.  Marland Kitchen levocetirizine (XYZAL) 5 MG tablet Take 1 tablet (5 mg total) by mouth daily as needed for allergies.  Marland Kitchen levothyroxine (SYNTHROID, LEVOTHROID) 75 MCG tablet TAKE 1 TABLET(75 MCG) BY MOUTH DAILY.  . naproxen sodium (ANAPROX) 220 MG tablet  Take 220 mg by mouth as needed.    Review of Systems  Constitutional: Positive for fatigue. Negative for chills and fever.  HENT: Positive for congestion and sinus pressure. Negative for ear pain (crackling/fullness) and sinus pain.   Respiratory: Negative for shortness of breath and wheezing. Cough: minimal.     Objective:  BP 122/64 (BP Location: Left Arm, Patient Position: Sitting, Cuff Size: Normal)   Pulse 76   Temp 98.2 F (36.8 C) (Oral)   Ht 5\' 7"  (1.702 m)   Wt 192 lb 11.2 oz (87.4 kg)   LMP 01/03/2007 (Approximate)   SpO2 98%   BMI 30.18 kg/m   Weight: 192 lb 11.2 oz (87.4 kg)   BP Readings from Last 3 Encounters:  02/06/18 122/64  01/28/18 140/80  12/06/17 124/80   Wt Readings from Last 3 Encounters:  02/06/18 192 lb 11.2 oz (87.4 kg)  11/19/17 193 lb 3.2 oz (87.6 kg)  10/31/17 187 lb 9.6 oz (85.1 kg)    Physical Exam Constitutional:      General: She is not in acute distress.    Appearance: She is well-developed.  HENT:     Head: Normocephalic and atraumatic.     Right Ear: Tympanic membrane, ear canal and external ear normal.     Left Ear: Tympanic membrane, ear canal and external ear normal.     Nose: Mucosal edema present.     Right Sinus: Maxillary sinus  tenderness present. No frontal sinus tenderness.     Left Sinus: Maxillary sinus tenderness present. No frontal sinus tenderness.     Mouth/Throat:     Pharynx: Uvula midline. Posterior oropharyngeal erythema present.  Cardiovascular:     Rate and Rhythm: Normal rate and regular rhythm.  Pulmonary:     Effort: Pulmonary effort is normal. No respiratory distress.     Breath sounds: Normal breath sounds. No wheezing, rhonchi or rales.     Assessment/Plan 1. Acute non-recurrent maxillary sinusitis Continue with allergy medications as directed.  Will treat with doxycycline (chosen because she has tolerated well in the past and she is leaving for a trip tomorrow).  Advised consideration for use of  Afrin prior to airplane ride tomorrow.  Let us know if any worsening of symptoms.  Return if symptoms worsen or fail to improve.         Micheline Rough, MD

## 2018-02-06 NOTE — Patient Instructions (Signed)
Consider Afrin nasal spray prior to flight.

## 2018-02-25 DIAGNOSIS — H04123 Dry eye syndrome of bilateral lacrimal glands: Secondary | ICD-10-CM | POA: Diagnosis not present

## 2018-02-25 DIAGNOSIS — H5203 Hypermetropia, bilateral: Secondary | ICD-10-CM | POA: Diagnosis not present

## 2018-02-25 DIAGNOSIS — H2513 Age-related nuclear cataract, bilateral: Secondary | ICD-10-CM | POA: Diagnosis not present

## 2018-02-25 DIAGNOSIS — H524 Presbyopia: Secondary | ICD-10-CM | POA: Diagnosis not present

## 2018-02-28 DIAGNOSIS — Z23 Encounter for immunization: Secondary | ICD-10-CM | POA: Diagnosis not present

## 2018-02-28 DIAGNOSIS — C44722 Squamous cell carcinoma of skin of right lower limb, including hip: Secondary | ICD-10-CM | POA: Diagnosis not present

## 2018-02-28 DIAGNOSIS — D485 Neoplasm of uncertain behavior of skin: Secondary | ICD-10-CM | POA: Diagnosis not present

## 2018-03-20 ENCOUNTER — Telehealth: Payer: Self-pay | Admitting: Family Medicine

## 2018-03-20 MED ORDER — AZELASTINE HCL 0.15 % NA SOLN: 1.0000 | Freq: Every day | NASAL | 0 refills | Status: DC | PRN

## 2018-03-20 MED ORDER — FLUTICASONE PROPIONATE HFA 110 MCG/ACT IN AERO: 2.0000 | INHALATION_SPRAY | Freq: Two times a day (BID) | RESPIRATORY_TRACT | 0 refills | Status: DC

## 2018-03-20 MED ORDER — AMLODIPINE BESYLATE 5 MG PO TABS: 5.0000 mg | ORAL_TABLET | Freq: Every day | ORAL | 0 refills | Status: DC

## 2018-03-20 MED ORDER — ALBUTEROL SULFATE HFA 108 (90 BASE) MCG/ACT IN AERS: 1.0000 | INHALATION_SPRAY | RESPIRATORY_TRACT | 0 refills | Status: DC | PRN

## 2018-03-20 MED ORDER — LEVOTHYROXINE SODIUM 75 MCG PO TABS: ORAL_TABLET | ORAL | 1 refills | Status: DC

## 2018-03-20 NOTE — Telephone Encounter (Signed)
Rxs sent to the pts pharmacy.

## 2018-03-20 NOTE — Telephone Encounter (Signed)
Copied from [place] [id number]. Topic: Quick Communication - Rx Refill/Question >> Mar 20, 2018  9:59 AM Leward Quan A wrote: Medication: albuterol (VENTOLIN HFA) 108 (90 Base) MCG/ACT inhaler, amLODipine (NORVASC) 5 MG tablet, Azelastine HCl 0.15 % SOLN, fluticasone (FLOVENT HFA) 110 MCG/ACT inhaler, levothyroxine (SYNTHROID, LEVOTHROID) 75 MCG tablet  Has the patient contacted their pharmacy? Yes.   (Agent: If no, request that the patient contact the pharmacy for the refill.) (Agent: If yes, when and what did the pharmacy advise?)  Preferred Pharmacy (with phone number or street name): [facility], [place] [phone] (Phone) [phone] (Fax)    Agent: Please be advised that RX refills may take up to 3 business days. We ask that you follow-up with your pharmacy.

## 2018-03-20 NOTE — Telephone Encounter (Signed)
Ok for all to be refilled.

## 2018-03-25 ENCOUNTER — Telehealth: Payer: Self-pay | Admitting: *Deleted

## 2018-03-25 NOTE — Telephone Encounter (Signed)
Copied from [place] [phone] [id number]. Topic: General - Other >> Mar 25, 2018 11:01 AM Leward Quan A wrote: Reason for CRM: Per message left by patient she does not want to switch from fluticasone (FLOVENT HFA) 110 MCG/ACT inhaler stated that it is working very well. Please advise

## 2018-03-25 NOTE — Telephone Encounter (Signed)
I left a message for the pt to call back with more information as to why the medication is to be switched?  CRM also created.

## 2018-03-26 ENCOUNTER — Telehealth: Payer: Self-pay | Admitting: Allergy and Immunology

## 2018-03-26 NOTE — Telephone Encounter (Signed)
Called patient. No answer and unable to leave a message.

## 2018-03-26 NOTE — Telephone Encounter (Signed)
Patient is calling about FLOVENT meds are working great - no brionchitis - patient is happy Patient states that PCP sent in the refill for the [place] to [place] Apparently the pharmacy is trying to change the medication to [facility] Patient does not want to change due to doing so well on currents  Patient wants [facility] to call and "override" this to make sure the pharmacy doesn't give her the wrong medicine

## 2018-03-27 ENCOUNTER — Other Ambulatory Visit: Payer: Self-pay | Admitting: *Deleted

## 2018-03-27 MED ORDER — AMLODIPINE BESYLATE 5 MG PO TABS: 5.0000 mg | ORAL_TABLET | Freq: Every day | ORAL | 1 refills | Status: DC

## 2018-03-27 MED ORDER — LEVOTHYROXINE SODIUM 75 MCG PO TABS: ORAL_TABLET | ORAL | 1 refills | Status: DC

## 2018-03-27 NOTE — Telephone Encounter (Signed)
Rx done. 

## 2018-03-28 NOTE — Telephone Encounter (Signed)
Called patient again and did not receive an answer.

## 2018-03-29 NOTE — Telephone Encounter (Signed)
Called pt. No answer. No vm.

## 2018-04-25 DIAGNOSIS — C44722 Squamous cell carcinoma of skin of right lower limb, including hip: Secondary | ICD-10-CM | POA: Diagnosis not present

## 2018-06-03 ENCOUNTER — Ambulatory Visit: Payer: BLUE CROSS/BLUE SHIELD | Admitting: Allergy and Immunology

## 2018-06-11 DIAGNOSIS — I10 Essential (primary) hypertension: Secondary | ICD-10-CM | POA: Diagnosis not present

## 2018-06-11 DIAGNOSIS — L089 Local infection of the skin and subcutaneous tissue, unspecified: Secondary | ICD-10-CM | POA: Diagnosis not present

## 2018-06-11 DIAGNOSIS — E669 Obesity, unspecified: Secondary | ICD-10-CM | POA: Diagnosis not present

## 2018-06-11 DIAGNOSIS — J302 Other seasonal allergic rhinitis: Secondary | ICD-10-CM | POA: Diagnosis not present

## 2018-06-11 DIAGNOSIS — J45909 Unspecified asthma, uncomplicated: Secondary | ICD-10-CM | POA: Diagnosis not present

## 2018-06-24 ENCOUNTER — Ambulatory Visit: Payer: BLUE CROSS/BLUE SHIELD | Admitting: Allergy and Immunology

## 2018-06-24 ENCOUNTER — Encounter: Payer: Self-pay | Admitting: Allergy and Immunology

## 2018-06-24 ENCOUNTER — Other Ambulatory Visit: Payer: Self-pay

## 2018-06-24 VITALS — BP 142/72 | HR 77 | Temp 97.7°F | Resp 18

## 2018-06-24 DIAGNOSIS — J3089 Other allergic rhinitis: Secondary | ICD-10-CM

## 2018-06-24 DIAGNOSIS — J4530 Mild persistent asthma, uncomplicated: Secondary | ICD-10-CM

## 2018-06-24 MED ORDER — LEVOCETIRIZINE DIHYDROCHLORIDE 5 MG PO TABS: 5.0000 mg | ORAL_TABLET | Freq: Every evening | ORAL | 5 refills | Status: DC

## 2018-06-24 MED ORDER — ALBUTEROL SULFATE HFA 108 (90 BASE) MCG/ACT IN AERS: 1.0000 | INHALATION_SPRAY | RESPIRATORY_TRACT | 1 refills | Status: AC | PRN

## 2018-06-24 MED ORDER — FLOVENT HFA 110 MCG/ACT IN AERO: 2.0000 | INHALATION_SPRAY | Freq: Two times a day (BID) | RESPIRATORY_TRACT | 1 refills | Status: DC

## 2018-06-24 NOTE — Patient Instructions (Addendum)
Mild persistent asthma Currently well controlled.  Continue Flovent 110 g, 2 inhalations via spacer device twice daily, and albuterol HFA, 1 to 2 inhalations every 4-6 hours if needed.  During respiratory tract infections or asthma flares, increase Flovent 110g to 3 inhalations 3 times per day until symptoms have returned to baseline.  Subjective and objective measures of pulmonary function will be followed and the treatment plan will be adjusted accordingly.  Allergic rhinitis with a nonallergic component  Continue appropriate allergen avoidance measures.  A prescription has been provided for levocetirizine (Xyzal), 5 mg daily as needed.  To avoid diminishing benefit with daily use (tachyphylaxis) of second generation antihistamine, consider alternating every few months between fexofenadine (Allegra) and levocetirizine (Xyzal).  Continue Xlear and/or nasal saline spray (i.e., Simply Saline) or nasal saline lavage (i.e., NeilMed) as needed.   Return in about 5 months (around 11/24/2018), or if symptoms worsen or fail to improve.

## 2018-06-24 NOTE — Assessment & Plan Note (Signed)
Currently well controlled.  Continue Flovent 110 g, 2 inhalations via spacer device twice daily, and albuterol HFA, 1 to 2 inhalations every 4-6 hours if needed.  During respiratory tract infections or asthma flares, increase Flovent 110g to 3 inhalations 3 times per day until symptoms have returned to baseline.  Subjective and objective measures of pulmonary function will be followed and the treatment plan will be adjusted accordingly.

## 2018-06-24 NOTE — Progress Notes (Signed)
Follow-up Note  RE: Jasmin Small MRN: [id number] DOB: 08/16/1955 Date of Office Visit: 06/24/2018  Primary care provider: No primary care provider on file. Referring provider: Josetta Huddle, MD  History of present illness: Jasmin Small is a 63 y.o. female with persistent asthma and mixed rhinitis presenting today for follow-up.  She was last seen in this clinic on January 28, 2018.  She reports that in the interval since her previous visit her asthma has been well controlled with Flovent 110 micro grams, 2 inhalations via spacer device twice daily.  In the interval since her previous visit she has not required asthma rescue medication, experienced nocturnal awakenings due to lower respiratory symptoms, nor have activities of daily living been limited.  She reports that she discontinued montelukast.  In addition, she discontinued azelastine nasal spray and fluticasone nasal spray because she believed that they diminished her sense of smell.  Once she discontinued these nasal sprays her sense of smell returned.  She has been using the over-the-counter nasal spray, Xlear, with perceived benefit.  She has taken cetirizine daily for several months but is beginning to believe that the benefit of this medication is diminishing.  Assessment and plan: Mild persistent asthma Currently well controlled.  Continue Flovent 110 g, 2 inhalations via spacer device twice daily, and albuterol HFA, 1 to 2 inhalations every 4-6 hours if needed.  During respiratory tract infections or asthma flares, increase Flovent 110g to 3 inhalations 3 times per day until symptoms have returned to baseline.  Subjective and objective measures of pulmonary function will be followed and the treatment plan will be adjusted accordingly.  Allergic rhinitis with a nonallergic component  Continue appropriate allergen avoidance measures.  A prescription has been provided for levocetirizine (Xyzal), 5 mg daily as needed.  To  avoid diminishing benefit with daily use (tachyphylaxis) of second generation antihistamine, consider alternating every few months between fexofenadine (Allegra) and levocetirizine (Xyzal).  Continue Xlear and/or nasal saline spray (i.e., Simply Saline) or nasal saline lavage (i.e., NeilMed) as needed.   Meds ordered this encounter  Medications  . albuterol (VENTOLIN HFA) 108 (90 Base) MCG/ACT inhaler    Sig: Inhale 1-2 puffs into the lungs every 4 (four) hours as needed for wheezing or shortness of breath.    Dispense:  54 g    Refill:  1  . fluticasone (FLOVENT HFA) 110 MCG/ACT inhaler    Sig: Inhale 2 puffs into the lungs 2 (two) times daily.    Dispense:  3 Inhaler    Refill:  1  . levocetirizine (XYZAL) 5 MG tablet    Sig: Take 1 tablet (5 mg total) by mouth every evening.    Dispense:  30 tablet    Refill:  5    Diagnostics: Spirometry:  Normal with an FEV1 of 101% predicted. This study was performed while the patient was asymptomatic.  Please see scanned spirometry results for details.    Physical examination: Blood pressure (!) 142/72, pulse 77, temperature 97.7 F (36.5 C), temperature source Temporal, resp. rate 18, last menstrual period 01/03/2007, SpO2 93 %.  General: Alert, interactive, in no acute distress. HEENT: TMs pearly gray, turbinates mildly edematous without discharge, post-pharynx mildly erythematous. Neck: Supple without lymphadenopathy. Lungs: Clear to auscultation without wheezing, rhonchi or rales. CV: Normal S1, S2 without murmurs. Skin: Warm and dry, without lesions or rashes.  The following portions of the patient's history were reviewed and updated as appropriate: allergies, current medications, past family history, past medical history,  past social history, past surgical history and problem list.  Allergies as of 06/24/2018   No Known Allergies     Medication List       Accurate as of June 24, 2018  6:06 PM. If you have any questions, ask  your nurse or doctor.        STOP taking these medications   Azelastine HCl 0.15 % Soln Stopped by: Edmonia Lynch, MD   montelukast 10 MG tablet Commonly known as: SINGULAIR Stopped by: Edmonia Lynch, MD     TAKE these medications   albuterol 108 (90 Base) MCG/ACT inhaler Commonly known as: Ventolin HFA Inhale 1-2 puffs into the lungs every 4 (four) hours as needed for wheezing or shortness of breath.   amLODipine 5 MG tablet Commonly known as: NORVASC Take 1 tablet (5 mg total) by mouth daily.   Caltrate 600+D 600-400 MG-UNIT tablet Generic drug: Calcium Carbonate-Vitamin D Take 1 tablet by mouth 2 (two) times daily.   Flovent HFA 110 MCG/ACT inhaler Generic drug: fluticasone Inhale 2 puffs into the lungs 2 (two) times daily.   levocetirizine 5 MG tablet Commonly known as: XYZAL Take 1 tablet (5 mg total) by mouth every evening. What changed:   when to take this  reasons to take this Changed by: R Edgar Frisk, MD   levothyroxine 75 MCG tablet Commonly known as: SYNTHROID TAKE 1 TABLET(75 MCG) BY MOUTH DAILY.   naproxen sodium 220 MG tablet Commonly known as: ALEVE Take 220 mg by mouth as needed.       No Known Allergies  Review of systems: Review of systems negative except as noted in HPI / PMHx or noted below: Constitutional: Negative.  HENT: Negative.   Eyes: Negative.  Respiratory: Negative.   Cardiovascular: Negative.  Gastrointestinal: Negative.  Genitourinary: Negative.  Musculoskeletal: Negative.  Neurological: Negative.  Endo/Heme/Allergies: Negative.  Cutaneous: Negative.  Past Medical History:  Diagnosis Date  . Actinic keratosis 01/17/2008  . ALLERGIC RHINITIS 11/07/2006  . ASTHMA 11/07/2006  . Asthma   . COLONIC POLYPS, HX OF 11/07/2006  . DIVERTICULOSIS, COLON 11/07/2006  . Hypertension   . Recurrent upper respiratory infection (URI)   . Thyroid disease    hypothyroid    Family History  Problem Relation Age of Onset   . Gout Father   . Cancer Father        bladder  . Arthritis Other   . Celiac disease Mother        sister  . Allergic rhinitis Mother   . Allergic rhinitis Sister   . Asthma Neg Hx   . Eczema Neg Hx   . Urticaria Neg Hx     Social History   Socioeconomic History  . Marital status: Married    Spouse name: Not on file  . Number of children: Not on file  . Years of education: Not on file  . Highest education level: Not on file  Occupational History  . Not on file  Social Needs  . Financial resource strain: Not on file  . Food insecurity    Worry: Not on file    Inability: Not on file  . Transportation needs    Medical: Not on file    Non-medical: Not on file  Tobacco Use  . Smoking status: Never Smoker  . Smokeless tobacco: Never Used  Substance and Sexual Activity  . Alcohol use: Yes    Alcohol/week: 5.0 standard drinks    Types: 5 Glasses of wine per  week    Comment: wine  . Drug use: No  . Sexual activity: Yes    Birth control/protection: Post-menopausal  Lifestyle  . Physical activity    Days per week: Not on file    Minutes per session: Not on file  . Stress: Not on file  Relationships  . Social Herbalist on phone: Not on file    Gets together: Not on file    Attends religious service: Not on file    Active member of club or organization: Not on file    Attends meetings of clubs or organizations: Not on file    Relationship status: Not on file  . Intimate partner violence    Fear of current or ex partner: Not on file    Emotionally abused: Not on file    Physically abused: Not on file    Forced sexual activity: Not on file  Other Topics Concern  . 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 Please do not hesitate to contact me with questions.  Sincerely,   R. Edgar Frisk, MD

## 2018-06-24 NOTE — Assessment & Plan Note (Signed)
   Continue appropriate allergen avoidance measures.  A prescription has been provided for levocetirizine (Xyzal), 5 mg daily as needed.  To avoid diminishing benefit with daily use (tachyphylaxis) of second generation antihistamine, consider alternating every few months between fexofenadine (Allegra) and levocetirizine (Xyzal).  Continue Xlear and/or nasal saline spray (i.e., Simply Saline) or nasal saline lavage (i.e., NeilMed) as needed.

## 2018-10-02 ENCOUNTER — Encounter: Payer: BLUE CROSS/BLUE SHIELD | Admitting: Family Medicine

## 2018-10-10 DIAGNOSIS — Z23 Encounter for immunization: Secondary | ICD-10-CM | POA: Diagnosis not present

## 2018-10-14 DIAGNOSIS — D219 Benign neoplasm of connective and other soft tissue, unspecified: Secondary | ICD-10-CM | POA: Diagnosis not present

## 2018-10-14 DIAGNOSIS — M79641 Pain in right hand: Secondary | ICD-10-CM | POA: Diagnosis not present

## 2018-10-18 DIAGNOSIS — Z86018 Personal history of other benign neoplasm: Secondary | ICD-10-CM | POA: Diagnosis not present

## 2018-10-18 DIAGNOSIS — L738 Other specified follicular disorders: Secondary | ICD-10-CM | POA: Diagnosis not present

## 2018-10-18 DIAGNOSIS — L570 Actinic keratosis: Secondary | ICD-10-CM | POA: Diagnosis not present

## 2018-10-18 DIAGNOSIS — L820 Inflamed seborrheic keratosis: Secondary | ICD-10-CM | POA: Diagnosis not present

## 2018-10-18 DIAGNOSIS — Z85828 Personal history of other malignant neoplasm of skin: Secondary | ICD-10-CM | POA: Diagnosis not present

## 2018-10-18 DIAGNOSIS — D225 Melanocytic nevi of trunk: Secondary | ICD-10-CM | POA: Diagnosis not present

## 2018-10-22 ENCOUNTER — Telehealth: Payer: Self-pay | Admitting: Allergy and Immunology

## 2018-10-22 ENCOUNTER — Other Ambulatory Visit: Payer: Self-pay | Admitting: *Deleted

## 2018-10-22 MED ORDER — LEVOCETIRIZINE DIHYDROCHLORIDE 5 MG PO TABS: 5.0000 mg | ORAL_TABLET | Freq: Every evening | ORAL | 0 refills | Status: DC

## 2018-10-22 NOTE — Telephone Encounter (Signed)
Pt called and made appointment for nov. 16 and needs refill on Xyzal [phone].

## 2018-10-22 NOTE — Telephone Encounter (Signed)
Called patient and confirmed which pharmacy to send the medication to. Patient confirmed [facility]. Medication has been sent in. Patient verbalized understanding.

## 2018-10-25 ENCOUNTER — Other Ambulatory Visit: Payer: Self-pay | Admitting: Family Medicine

## 2018-11-04 DIAGNOSIS — J45909 Unspecified asthma, uncomplicated: Secondary | ICD-10-CM | POA: Diagnosis not present

## 2018-11-04 DIAGNOSIS — I10 Essential (primary) hypertension: Secondary | ICD-10-CM | POA: Diagnosis not present

## 2018-11-04 DIAGNOSIS — E039 Hypothyroidism, unspecified: Secondary | ICD-10-CM | POA: Diagnosis not present

## 2018-11-04 DIAGNOSIS — Z13220 Encounter for screening for lipoid disorders: Secondary | ICD-10-CM | POA: Diagnosis not present

## 2018-11-04 DIAGNOSIS — Z23 Encounter for immunization: Secondary | ICD-10-CM | POA: Diagnosis not present

## 2018-11-04 DIAGNOSIS — E559 Vitamin D deficiency, unspecified: Secondary | ICD-10-CM | POA: Diagnosis not present

## 2018-11-04 DIAGNOSIS — J302 Other seasonal allergic rhinitis: Secondary | ICD-10-CM | POA: Diagnosis not present

## 2018-11-06 ENCOUNTER — Other Ambulatory Visit: Payer: Self-pay

## 2018-11-06 NOTE — Progress Notes (Signed)
63 y.o. [id number] Married White or Caucasian Not Hispanic or Latino female here for annual exam.  No vaginal bleeding. Sexually active, uses coconut oil, still uncomfortable.     Goes back and forth from here to [place], spent the summer in [place].   Patient's last menstrual period was 01/03/2007 (approximate).          Sexually active: Yes.    The current method of family planning is post menopausal status.    Exercising: Yes.    walks Smoker:  no  Health Maintenance: Pap:  11/01/2017, negative, negative hpv History of abnormal Pap:  no MMG:  05/09/17, cat b, bi-rads 1 BMD:   2011 Colonoscopy: cologuard last year, negative TDaP:  11/18/2012 Gardasil: no   reports that she has never smoked. She has never used smokeless tobacco. She reports current alcohol use of about 5.0 standard drinks of alcohol per week. She reports that she does not use drugs. She is self employed, works in Mining engineer. 2 daughters, one in [place] one in [place].   Past Medical History:  Diagnosis Date  . Actinic keratosis 01/17/2008  . ALLERGIC RHINITIS 11/07/2006  . ASTHMA 11/07/2006  . Asthma   . COLONIC POLYPS, HX OF 11/07/2006  . DIVERTICULOSIS, COLON 11/07/2006  . Hypertension   . Recurrent upper respiratory infection (URI)   . Thyroid disease    hypothyroid    Past Surgical History:  Procedure Laterality Date  . CESAREAN SECTION      Current Outpatient Medications  Medication Sig Dispense Refill  . albuterol (VENTOLIN HFA) 108 (90 Base) MCG/ACT inhaler Inhale 1-2 puffs into the lungs every 4 (four) hours as needed for wheezing or shortness of breath. 54 g 1  . amLODipine (NORVASC) 5 MG tablet Take 1 tablet (5 mg total) by mouth daily. 90 tablet 1  . Calcium Carbonate-Vitamin D (CALTRATE 600+D) 600-400 MG-UNIT per tablet Take 1 tablet by mouth 2 (two) times daily.      . fluticasone (FLOVENT HFA) 110 MCG/ACT inhaler Inhale 2 puffs into the lungs 2 (two) times daily. 3 Inhaler 1  . levocetirizine (XYZAL) 5 MG  tablet Take 1 tablet (5 mg total) by mouth every evening. 90 tablet 0  . levothyroxine (SYNTHROID) 75 MCG tablet TAKE 1 TABLET DAILY 90 tablet 0  . losartan (COZAAR) 50 MG tablet TK 1 T PO QD    . naproxen sodium (ANAPROX) 220 MG tablet Take 220 mg by mouth as needed.     No current facility-administered medications for this visit.     Family History  Problem Relation Age of Onset  . Gout Father   . Cancer Father        bladder  . Arthritis Other   . Celiac disease Mother        sister  . Allergic rhinitis Mother   . Allergic rhinitis Sister   . Asthma Neg Hx   . Eczema Neg Hx   . Urticaria Neg Hx     Review of Systems  All other systems reviewed and are negative.   Exam:   BP (!) 150/72   Pulse 69   Temp (!) 97.3 F (36.3 C)   Ht 5' 5.5" (1.664 m)   Wt 202 lb (91.6 kg)   LMP 01/03/2007 (Approximate)   SpO2 98%   BMI 33.10 kg/m   Weight change: @WEIGHTCHANGE @ Height:   Height: 5' 5.5" (166.4 cm)  Ht Readings from Last 3 Encounters:  11/07/18 5' 5.5" (1.664 m)  02/06/18  5\' 7"  (1.702 m)  11/19/17 5' 5.2" (1.656 m)    General appearance: alert, cooperative and appears stated age Head: Normocephalic, without obvious abnormality, atraumatic Neck: no adenopathy, supple, symmetrical, trachea midline and thyroid normal to inspection and palpation Lungs: clear to auscultation bilaterally Cardiovascular: regular rate and rhythm Breasts: normal appearance, no masses or tenderness Abdomen: soft, non-tender; non distended,  no masses,  no organomegaly Extremities: extremities normal, atraumatic, no cyanosis or edema Skin: Skin color, texture, turgor normal. No rashes or lesions Lymph nodes: Cervical, supraclavicular, and axillary nodes normal. No abnormal inguinal nodes palpated Neurologic: Grossly normal   Pelvic: External genitalia:  no lesions              Urethra:  normal appearing urethra with no masses, tenderness or lesions              Bartholins and Skenes:  normal                 Vagina: atrophic appearing vagina with normal color and discharge, no lesions              Cervix: no lesions               Bimanual Exam:  Uterus:  normal size, contour, position, consistency, mobility, non-tender              Adnexa: no mass, fullness, tenderness               Rectovaginal: Confirms               Anus:  normal sphincter tone, no lesions  Chaperone was present for exam.  A:  Well Woman with normal exam  P:   No pap this year  Mammogram due  Colon cancer screening UTD  DEXA next year  Discussed breast self exam  Discussed calcium and vit D intake  Labs with primary

## 2018-11-07 ENCOUNTER — Other Ambulatory Visit: Payer: Self-pay | Admitting: Internal Medicine

## 2018-11-07 ENCOUNTER — Encounter: Payer: Self-pay | Admitting: Obstetrics and Gynecology

## 2018-11-07 ENCOUNTER — Ambulatory Visit: Payer: BC Managed Care – PPO | Admitting: Obstetrics and Gynecology

## 2018-11-07 VITALS — BP 150/72 | HR 69 | Temp 97.3°F | Ht 65.5 in | Wt 202.0 lb

## 2018-11-07 DIAGNOSIS — Z01419 Encounter for gynecological examination (general) (routine) without abnormal findings: Secondary | ICD-10-CM

## 2018-11-07 DIAGNOSIS — N952 Postmenopausal atrophic vaginitis: Secondary | ICD-10-CM | POA: Diagnosis not present

## 2018-11-07 DIAGNOSIS — Z1231 Encounter for screening mammogram for malignant neoplasm of breast: Secondary | ICD-10-CM

## 2018-11-07 MED ORDER — ESTRADIOL 10 MCG VA TABS: ORAL_TABLET | VAGINAL | 4 refills | Status: DC

## 2018-11-07 NOTE — Patient Instructions (Addendum)
EXERCISE AND DIET:  We recommended that you start or continue a regular exercise program for good health. Regular exercise means any activity that makes your heart beat faster and makes you sweat.  We recommend exercising at least 30 minutes per day at least 3 days a week, preferably 4 or 5.  We also recommend a diet low in fat and sugar.  Inactivity, poor dietary choices and obesity can cause diabetes, heart attack, stroke, and kidney damage, among others.    ALCOHOL AND SMOKING:  Women should limit their alcohol intake to no more than 7 drinks/beers/glasses of wine (combined, not each!) per week. Moderation of alcohol intake to this level decreases your risk of breast cancer and liver damage. And of course, no recreational drugs are part of a healthy lifestyle.  And absolutely no smoking or even second hand smoke. Most people know smoking can cause heart and lung diseases, but did you know it also contributes to weakening of your bones? Aging of your skin?  Yellowing of your teeth and nails?  CALCIUM AND VITAMIN D:  Adequate intake of calcium and Vitamin D are recommended.  The recommendations for exact amounts of these supplements seem to change often, but generally speaking 1,200 mg of calcium (between diet and supplement) and 800 units of Vitamin D per day seems prudent. Certain women may benefit from higher intake of Vitamin D.  If you are among these women, your doctor will have told you during your visit.    PAP SMEARS:  Pap smears, to check for cervical cancer or precancers,  have traditionally been done yearly, although recent scientific advances have shown that most women can have pap smears less often.  However, every woman still should have a physical exam from her gynecologist every year. It will include a breast check, inspection of the vulva and vagina to check for abnormal growths or skin changes, a visual exam of the cervix, and then an exam to evaluate the size and shape of the uterus and  ovaries.  And after 64 years of age, a rectal exam is indicated to check for rectal cancers. We will also provide age appropriate advice regarding health maintenance, like when you should have certain vaccines, screening for sexually transmitted diseases, bone density testing, colonoscopy, mammograms, etc.   MAMMOGRAMS:  All women over 40 years old should have a yearly mammogram. Many facilities now offer a "3D" mammogram, which may cost around $50 extra out of pocket. If possible,  we recommend you accept the option to have the 3D mammogram performed.  It both reduces the number of women who will be called back for extra views which then turn out to be normal, and it is better than the routine mammogram at detecting truly abnormal areas.    COLON CANCER SCREENING: Now recommend starting at age 45. At this time colonoscopy is not covered for routine screening until 50. There are take home tests that can be done between 45-49.   COLONOSCOPY:  Colonoscopy to screen for colon cancer is recommended for all women at age 50.  We know, you hate the idea of the prep.  We agree, BUT, having colon cancer and not knowing it is worse!!  Colon cancer so often starts as a polyp that can be seen and removed at colonscopy, which can quite literally save your life!  And if your first colonoscopy is normal and you have no family history of colon cancer, most women don't have to have it again for   10 years.  Once every ten years, you can do something that may end up saving your life, right?  We will be happy to help you get it scheduled when you are ready.  Be sure to check your insurance coverage so you understand how much it will cost.  It may be covered as a preventative service at no cost, but you should check your particular policy.      Breast Self-Awareness Breast self-awareness means being familiar with how your breasts look and feel. It involves checking your breasts regularly and reporting any changes to your  health care provider. Practicing breast self-awareness is important. A change in your breasts can be a sign of a serious medical problem. Being familiar with how your breasts look and feel allows you to find any problems early, when treatment is more likely to be successful. All women should practice breast self-awareness, including women who have had breast implants. How to do a breast self-exam One way to learn what is normal for your breasts and whether your breasts are changing is to do a breast self-exam. To do a breast self-exam: Look for Changes  1. Remove all the clothing above your waist. 2. Stand in front of a mirror in a room with good lighting. 3. Put your hands on your hips. 4. Push your hands firmly downward. 5. Compare your breasts in the mirror. Look for differences between them (asymmetry), such as: ? Differences in shape. ? Differences in size. ? Puckers, dips, and bumps in one breast and not the other. 6. Look at each breast for changes in your skin, such as: ? Redness. ? Scaly areas. 7. Look for changes in your nipples, such as: ? Discharge. ? Bleeding. ? Dimpling. ? Redness. ? A change in position. Feel for Changes Carefully feel your breasts for lumps and changes. It is best to do this while lying on your back on the floor and again while sitting or standing in the shower or tub with soapy water on your skin. Feel each breast in the following way:  Place the arm on the side of the breast you are examining above your head.  Feel your breast with the other hand.  Start in the nipple area and make  inch (2 cm) overlapping circles to feel your breast. Use the pads of your three middle fingers to do this. Apply light pressure, then medium pressure, then firm pressure. The light pressure will allow you to feel the tissue closest to the skin. The medium pressure will allow you to feel the tissue that is a little deeper. The firm pressure will allow you to feel the tissue  close to the ribs.  Continue the overlapping circles, moving downward over the breast until you feel your ribs below your breast.  Move one finger-width toward the center of the body. Continue to use the  inch (2 cm) overlapping circles to feel your breast as you move slowly up toward your collarbone.  Continue the up and down exam using all three pressures until you reach your armpit.  Write Down What You Find  Write down what is normal for each breast and any changes that you find. Keep a written record with breast changes or normal findings for each breast. By writing this information down, you do not need to depend only on memory for size, tenderness, or location. Write down where you are in your menstrual cycle, if you are still menstruating. If you are having trouble noticing differences   in your breasts, do not get discouraged. With time you will become more familiar with the variations in your breasts and more comfortable with the exam. How often should I examine my breasts? Examine your breasts every month. If you are breastfeeding, the best time to examine your breasts is after a feeding or after using a breast pump. If you menstruate, the best time to examine your breasts is 5-7 days after your period is over. During your period, your breasts are lumpier, and it may be more difficult to notice changes. When should I see my health care provider? See your health care provider if you notice:  A change in shape or size of your breasts or nipples.  A change in the skin of your breast or nipples, such as a reddened or scaly area.  Unusual discharge from your nipples.  A lump or thick area that was not there before.  Pain in your breasts.  Anything that concerns you.  Atrophic Vaginitis  Atrophic vaginitis is a condition in which the tissues that line the vagina become dry and thin. This condition is most common in women who have stopped having regular menstrual periods (are in  menopause). This usually starts when a woman is 45-55 years old. That is the time when a woman's estrogen levels begin to drop (decrease). Estrogen is a female hormone. It helps to keep the tissues of the vagina moist. It stimulates the vagina to produce a clear fluid that lubricates the vagina for sexual intercourse. This fluid also protects the vagina from infection. Lack of estrogen can cause the lining of the vagina to get thinner and dryer. The vagina may also shrink in size. It may become less elastic. Atrophic vaginitis tends to get worse over time as a woman's estrogen level drops. What are the causes? This condition is caused by the normal drop in estrogen that happens around the time of menopause. What increases the risk? Certain conditions or situations may lower a woman's estrogen level, leading to a higher risk for atrophic vaginitis. You are more likely to develop this condition if:  You are taking medicines that block estrogen.  You have had your ovaries removed.  You are being treated for cancer with X-ray (radiation) or medicines (chemotherapy).  You have given birth or are breastfeeding.  You are older than age 50.  You smoke. What are the signs or symptoms? Symptoms of this condition include:  Pain, soreness, or bleeding during sexual intercourse (dyspareunia).  Vaginal burning, irritation, or itching.  Pain or bleeding when a speculum is used in a vaginal exam (pelvic exam).  Having burning pain when passing urine.  Vaginal discharge that is brown or yellow. In some cases, there are no symptoms. How is this diagnosed? This condition is diagnosed by taking a medical history and doing a physical exam. This will include a pelvic exam that checks the vaginal tissues. Though rare, you may also have other tests, including:  A urine test.  A test that checks the acid balance in your vagina (acid balance test). How is this treated? Treatment for this condition  depends on how severe your symptoms are. Treatment may include:  Using an over-the-counter vaginal lubricant before sex.  Using a long-acting vaginal moisturizer.  Using low-dose vaginal estrogen for moderate to severe symptoms that do not respond to other treatments. Options include creams, tablets, and inserts (vaginal rings). Before you use a vaginal estrogen, tell your health care provider if you have a history of: ?   of: ? Breast cancer. ? Endometrial cancer. ? Blood clots. If you are not sexually active and your symptoms are very mild, you may not need treatment. Follow these instructions at home: Medicines  Take over-the-counter and prescription medicines only as told by your health care provider. Do not use herbal or alternative medicines unless your health care provider says that you can.  Use over-the-counter creams, lubricants, or moisturizers for dryness only as directed by your health care provider. General instructions  If your atrophic vaginitis is caused by menopause, discuss all of your menopause symptoms and treatment options with your health care provider.  Do not douche.  Do not use products that can make your vagina dry. These include: ? Scented feminine sprays. ? Scented tampons. ? Scented soaps.  Vaginal intercourse can help to improve blood flow and elasticity of vaginal tissue. If it hurts to have sex, try using a lubricant or moisturizer just before having intercourse. Contact a health care provider if:  Your discharge looks different than normal.  Your vagina has an unusual smell.  You have new symptoms.  Your symptoms do not improve with treatment.  Your symptoms get worse. Summary  Atrophic vaginitis is a condition in which the tissues that line the vagina become dry and thin. It is most common in women who have stopped having regular menstrual periods (are in menopause).  Treatment options include using vaginal lubricants and low-dose vaginal  estrogen.  Contact a health care provider if your vagina has an unusual smell, or if your symptoms get worse or do not improve after treatment. This information is not intended to replace advice given to you by your health care provider. Make sure you discuss any questions you have with your health care provider. Document Released: 05/05/2014 Document Revised: 12/01/2016 Document Reviewed: 09/14/2016 Elsevier Patient Education  2020 [employer].

## 2018-11-08 ENCOUNTER — Other Ambulatory Visit: Payer: Self-pay

## 2018-11-08 ENCOUNTER — Ambulatory Visit: Payer: BC Managed Care – PPO | Admitting: Family Medicine

## 2018-11-08 ENCOUNTER — Encounter: Payer: Self-pay | Admitting: Family Medicine

## 2018-11-08 VITALS — BP 140/80 | HR 78 | Temp 97.5°F | Resp 18 | Ht 65.5 in | Wt 200.0 lb

## 2018-11-08 DIAGNOSIS — J4540 Moderate persistent asthma, uncomplicated: Secondary | ICD-10-CM | POA: Insufficient documentation

## 2018-11-08 DIAGNOSIS — J0100 Acute maxillary sinusitis, unspecified: Secondary | ICD-10-CM

## 2018-11-08 DIAGNOSIS — H6983 Other specified disorders of Eustachian tube, bilateral: Secondary | ICD-10-CM

## 2018-11-08 DIAGNOSIS — J3089 Other allergic rhinitis: Secondary | ICD-10-CM

## 2018-11-08 DIAGNOSIS — H6993 Unspecified Eustachian tube disorder, bilateral: Secondary | ICD-10-CM | POA: Insufficient documentation

## 2018-11-08 MED ORDER — AMOXICILLIN-POT CLAVULANATE 875-125 MG PO TABS: 1.0000 | ORAL_TABLET | Freq: Two times a day (BID) | ORAL | 0 refills | Status: AC

## 2018-11-08 NOTE — Progress Notes (Signed)
[facility] [place] [id number] Dept: [phone]  FOLLOW UP NOTE  Patient ID: Jasmin Small, female    DOB: February 18, 1955  Age: 63 y.o. MRN: [id number] Date of Office Visit: 11/08/2018  Assessment  Chief Complaint: Nasal Congestion (ongoing for 3-4 weeks since returning from [place]. she has been using "Xlear nasal spray". she does get very little stuff out of her nose when she blows it. she is having to breathe through her mouth at night because of the inflammation in her nose. )  HPI Jasmin Small is a 63 year old female who presents to the clinic for a sick visit. She was last seen in this clinic on 06/24/2018 by Dr. Verlin Fester for evaluation of asthma and allergic rhinitis. At today's visit, she reports that she has been experiencing nasal congestion and post nasal drainage for the last month for which she has been taking Xyzal once a day and using Xlear nasal spray. She reports that over the last several days, her nasal congestion has increased and she has been experiencing facial pressure, and pain above her teeth in addition to chunky thick yellow drainage during her sinus rinses. She denies fever. She reports a fullness and popping sound in bilateral ears with left more than right. Asthma is reported as well controlled with no shortness of breath or wheeze. She reports occasional cough that is associated with post nasal drainage. She continues Flovent 110-2 puffs twice a day with a spacer and rarely needs albuterol. Her current medications are listed in the chart.    Drug Allergies:  No Known Allergies  Physical Exam: BP 140/80 (BP Location: Left Arm, Patient Position: Sitting, Cuff Size: Normal)   Pulse 78   Temp (!) 97.5 F (36.4 C) (Temporal)   Resp 18   Ht 5' 5.5" (1.664 m)   Wt 200 lb (90.7 kg)   LMP 01/03/2007 (Approximate)   SpO2 98%   BMI 32.78 kg/m    Physical Exam Vitals signs reviewed.  Constitutional:      Appearance: Normal appearance.  HENT:     Head:  Normocephalic and atraumatic.     Right Ear: Tympanic membrane normal.     Left Ear: Tympanic membrane normal.     Nose:     Comments: Bilateral nares erythematous with clear nasal drainage noted.  Pharynx erythematous with no exudate.  Ears normal.  Eyes normal. Eyes:     Conjunctiva/sclera: Conjunctivae normal.  Neck:     Musculoskeletal: Normal range of motion and neck supple.  Cardiovascular:     Rate and Rhythm: Normal rate and regular rhythm.     Heart sounds: Normal heart sounds. No murmur.  Pulmonary:     Effort: Pulmonary effort is normal.     Breath sounds: Normal breath sounds.     Comments: Lungs clear to auscultation Musculoskeletal: Normal range of motion.  Skin:    General: Skin is warm and dry.  Neurological:     Mental Status: She is alert and oriented to person, place, and time.  Psychiatric:        Mood and Affect: Mood normal.        Behavior: Behavior normal.        Thought Content: Thought content normal.        Judgment: Judgment normal.     Assessment and Plan: 1. Moderate persistent asthma without complication   2. Allergic rhinitis with a nonallergic component   3. Acute non-recurrent maxillary sinusitis   4. Eustachian tube  dysfunction, bilateral     Meds ordered this encounter  Medications  . amoxicillin-clavulanate (AUGMENTIN) 875-125 MG tablet    Sig: Take 1 tablet by mouth 2 (two) times daily for 10 days.    Dispense:  20 tablet    Refill:  0    Patient Instructions  Acute sinusitis Begin Augmentin 875 mg tablets. Take 1 tablet twice a day for 10 days for sinus infection.  Prednisone 10 mg tablets. Take 2 tablets once a day for 4 days, then take 1 tablet on the 5th day, then stop.  Begin nasal saline rinses (NeilMed bottle given)  Allergic rhinitis Continue Xyzal 5 mg once a day as needed for a runny nose Continue  Xlear (xylitol) Begin saline nasal rinses as needed for nasal symptoms. Use this before any medicated nasal sprays for  best result  Asthma Continue Flovent 110-2 puffs twice a day with a spacer to prevent cough or wheeze Continue albuterol 2 puffs every 4 hpurs as needed for cough or wheeze Call the clinic if this treatment plan is not working well for you  Bilateral eustachian tube dysfunction Continue with nasal rinses and prednisone as above  Follow up in 2 months or sooner if needed.   Return in about 2 months (around 01/08/2019), or if symptoms worsen or fail to improve.    Thank you for the opportunity to care for this patient.  Please do not hesitate to contact me with questions.  Gareth Morgan, FNP [facility]

## 2018-11-08 NOTE — Patient Instructions (Addendum)
Acute sinusitis Begin Augmentin 875 mg tablets. Take 1 tablet twice a day for 10 days for sinus infection.  Prednisone 10 mg tablets. Take 2 tablets once a day for 4 days, then take 1 tablet on the 5th day, then stop.  Begin nasal saline rinses (NeilMed bottle given)  Allergic rhinitis Continue Xyzal 5 mg once a day as needed for a runny nose Continue  Xlear (xylitol) Begin saline nasal rinses as needed for nasal symptoms. Use this before any medicated nasal sprays for best result  Asthma Continue Flovent 110-2 puffs twice a day with a spacer to prevent cough or wheeze Continue albuterol 2 puffs every 4 hpurs as needed for cough or wheeze Call the clinic if this treatment plan is not working well for you  Bilateral eustachian tube dysfunction Continue with nasal rinses and prednisone as above  Follow up in 2 months or sooner if needed.

## 2018-11-11 ENCOUNTER — Ambulatory Visit: Payer: BC Managed Care – PPO | Admitting: Allergy and Immunology

## 2018-11-12 ENCOUNTER — Other Ambulatory Visit: Payer: Self-pay

## 2018-11-12 ENCOUNTER — Ambulatory Visit
Admission: RE | Admit: 2018-11-12 | Discharge: 2018-11-12 | Disposition: A | Discharge status: Home / Self Care | Payer: BC Managed Care – PPO | Source: Ambulatory Visit | Attending: Internal Medicine | Admitting: Internal Medicine

## 2018-11-12 DIAGNOSIS — Z1231 Encounter for screening mammogram for malignant neoplasm of breast: Secondary | ICD-10-CM

## 2018-11-18 ENCOUNTER — Other Ambulatory Visit: Payer: Self-pay

## 2018-11-18 ENCOUNTER — Ambulatory Visit: Payer: BC Managed Care – PPO | Admitting: Allergy and Immunology

## 2018-11-18 DIAGNOSIS — Z20822 Contact with and (suspected) exposure to covid-19: Secondary | ICD-10-CM

## 2018-11-20 LAB — NOVEL CORONAVIRUS, NAA: SARS-CoV-2, NAA: NOT DETECTED

## 2018-12-23 DIAGNOSIS — Z20828 Contact with and (suspected) exposure to other viral communicable diseases: Secondary | ICD-10-CM | POA: Diagnosis not present

## 2018-12-23 DIAGNOSIS — R0981 Nasal congestion: Secondary | ICD-10-CM | POA: Diagnosis not present

## 2019-04-18 DIAGNOSIS — L578 Other skin changes due to chronic exposure to nonionizing radiation: Secondary | ICD-10-CM | POA: Diagnosis not present

## 2019-04-18 DIAGNOSIS — D485 Neoplasm of uncertain behavior of skin: Secondary | ICD-10-CM | POA: Diagnosis not present

## 2019-04-18 DIAGNOSIS — L570 Actinic keratosis: Secondary | ICD-10-CM | POA: Diagnosis not present

## 2019-04-18 DIAGNOSIS — D225 Melanocytic nevi of trunk: Secondary | ICD-10-CM | POA: Diagnosis not present

## 2019-05-05 ENCOUNTER — Other Ambulatory Visit: Payer: Self-pay | Admitting: Allergy and Immunology

## 2019-05-05 DIAGNOSIS — I10 Essential (primary) hypertension: Secondary | ICD-10-CM | POA: Diagnosis not present

## 2019-05-05 DIAGNOSIS — E039 Hypothyroidism, unspecified: Secondary | ICD-10-CM | POA: Diagnosis not present

## 2019-05-05 DIAGNOSIS — J45909 Unspecified asthma, uncomplicated: Secondary | ICD-10-CM | POA: Diagnosis not present

## 2019-05-05 DIAGNOSIS — E559 Vitamin D deficiency, unspecified: Secondary | ICD-10-CM | POA: Diagnosis not present

## 2019-07-21 ENCOUNTER — Other Ambulatory Visit: Payer: Self-pay | Admitting: Allergy and Immunology

## 2019-07-28 ENCOUNTER — Telehealth: Payer: Self-pay | Admitting: Allergy and Immunology

## 2019-07-28 NOTE — Telephone Encounter (Signed)
Dr. Verlin Fester patient has already had a courtesy refill. Are you okay with final fill until seen?

## 2019-07-28 NOTE — Telephone Encounter (Signed)
Patient called and made an appointment with [name] 8/16 and would like to know if her Flovent can be refilled. Informed patient she had a courtesy refill in May and she would need to come in for a visit first. Patient states her Jasmin Small runs out in August and wants to ask Dr. Verlin Fester for a refill until she has her visit.  Please advise.

## 2019-07-28 NOTE — Telephone Encounter (Signed)
We can give her a refill. She should probably be put on Anne or Chrissy's schedule so she can be seen earlier than 8/16. Thanks.

## 2019-07-29 MED ORDER — FLOVENT HFA 110 MCG/ACT IN AERO: INHALATION_SPRAY | RESPIRATORY_TRACT | 0 refills | Status: DC

## 2019-07-29 NOTE — Telephone Encounter (Signed)
Noted. Inhaler sent should last until seen. Thank you for your hard work!

## 2019-07-29 NOTE — Telephone Encounter (Signed)
Patient requested appointment during that week and did not want to see a nurse practitioner.

## 2019-07-29 NOTE — Telephone Encounter (Signed)
Final courtesy refill has been sent in. Can you see if there is anything sooner with one of the FNP's?

## 2019-08-14 ENCOUNTER — Encounter: Payer: Self-pay | Admitting: Obstetrics and Gynecology

## 2019-08-17 DIAGNOSIS — Z20828 Contact with and (suspected) exposure to other viral communicable diseases: Secondary | ICD-10-CM | POA: Diagnosis not present

## 2019-08-17 NOTE — Patient Instructions (Addendum)
Moderate persistent asthma Continue Flovent 110- 2 puffs twice a day with spacer to help prevent cough and wheeze May use albuterol 2 puffs every 4 hours as needed for cough, wheeze, tightness in chest, or shortness of breath. Asthma control goals:   Full participation in all desired activities (may need albuterol before activity)  Albuterol use two time or less a week on average (not counting use with activity)  Cough interfering with sleep two time or less a month  Oral steroids no more than once a year  No hospitalizations  Allergic rhinitis with a non-allergic component Stop Xyzal or Zyrtec Start carbinoxamine 4 mg- take one tablet twice a day as needed for drainage. Caution that this can make you drowsy Continue Xlerar (xylitol) or sinus rinse  Eustachian tube dysfunction She will do further research on which ENT she would like to see and will call our office if a referral is required.  Please let us know if this treatment plan is not working well for you. Schedule a follow up appointment in 5 months

## 2019-08-18 ENCOUNTER — Ambulatory Visit: Payer: BC Managed Care – PPO | Admitting: Family

## 2019-08-18 ENCOUNTER — Other Ambulatory Visit: Payer: Self-pay

## 2019-08-18 ENCOUNTER — Encounter: Payer: Self-pay | Admitting: Family

## 2019-08-18 VITALS — BP 122/62 | HR 68 | Temp 97.5°F | Resp 18 | Wt 202.4 lb

## 2019-08-18 DIAGNOSIS — J4540 Moderate persistent asthma, uncomplicated: Secondary | ICD-10-CM | POA: Diagnosis not present

## 2019-08-18 DIAGNOSIS — J3089 Other allergic rhinitis: Secondary | ICD-10-CM

## 2019-08-18 DIAGNOSIS — H6982 Other specified disorders of Eustachian tube, left ear: Secondary | ICD-10-CM

## 2019-08-18 DIAGNOSIS — H6992 Unspecified Eustachian tube disorder, left ear: Secondary | ICD-10-CM

## 2019-08-18 MED ORDER — FLOVENT HFA 110 MCG/ACT IN AERO: INHALATION_SPRAY | RESPIRATORY_TRACT | 5 refills | Status: DC

## 2019-08-18 MED ORDER — CARBINOXAMINE MALEATE 4 MG PO TABS: 1.0000 | ORAL_TABLET | Freq: Two times a day (BID) | ORAL | 5 refills | Status: DC | PRN

## 2019-08-18 NOTE — Progress Notes (Signed)
[place]  [place] [address] Dept: [phone]  FOLLOW UP NOTE  Patient ID: Jasmin Small, female    DOB: [birth year]  Age: 64 y.o. MRN: [id number] Date of Office Visit: 08/18/2019  Assessment  Chief Complaint: Follow-up, Asthma, and Allergies  HPI Jasmin Small is a 64 year old female who presents for follow-up of moderate persistent asthma and allergic rhinitis with a nonallergic component.  She was last seen on November 08, 2018 by Gareth Morgan, FNP.  Moderate persistent asthma is reported as controlled with the use of Flovent 110 mcg 2 puffs twice a day with a spacer and albuterol as needed.  She does report that there are times that she will use 1 puff of Flovent in the morning and 2 puffs at night depending on how she is feeling.  She denies any coughing, wheezing, tightness in her chest, shortness of breath, and nocturnal awakenings.  Since her last office visit she has not required any systemic steroids or made any trips to the emergency room or urgent care.  She has also not used her albuterol inhaler since her last office visit.  Allergic rhinitis with a nonallergic component is reported as moderately controlled with Zyrtec or Xyzal once a day,Xlear or sinus rinse as needed.  She is currently using Zyrtec 10 mg once a day due to being out of Xyzal.  She cannot notice any difference in her symptoms with the use of Zyrtec or Xyzal.  She reports congestion in the left side of her throat,  having to clear her throat due to postnasal drip and occasional sneezing.  She denies any nasal congestion, rhinorrhea, and sinus tenderness.  She was unable to use nasal sprays (azelastine nasal spray and fluticasone nasal spray) due to diminished sense of smell.  She reports that she is able to smell now.  She reports that she has had left eustachian tube dysfunction that has been ongoing for at least the past 2 years.  She has never seen an ENT and is interested in pursuing this  route.  Current medications are as listed in the chart.  Drug Allergies:  No Known Allergies  Review of Systems: Review of Systems  Constitutional: Negative for chills and fever.  HENT: Positive for congestion. Negative for sinus pain.        Reports post nasal drip and denies rhinorrhea  Eyes:       Denies itchy watery eyes  Respiratory: Negative for cough, shortness of breath and wheezing.   Cardiovascular: Negative for chest pain and palpitations.  Gastrointestinal: Negative for abdominal pain and heartburn.  Genitourinary: Negative for dysuria.  Neurological: Negative for headaches.  Endo/Heme/Allergies: Positive for environmental allergies.    Physical Exam: BP 122/62    Pulse 68    Temp (!) 97.5 F (36.4 C) (Temporal)    Resp 18    Wt 202 lb 6.4 oz (91.8 kg)    LMP 01/03/2007 (Approximate)    SpO2 98%    BMI 33.17 kg/m    Physical Exam Constitutional:      Appearance: Normal appearance.  HENT:     Head: Normocephalic and atraumatic.     Comments: Pharynx normal. Eyes normal. Ears normal. Nose normal    Right Ear: Tympanic membrane, ear canal and external ear normal.     Left Ear: Tympanic membrane, ear canal and external ear normal.     Nose: Nose normal.     Mouth/Throat:     Mouth: Mucous membranes are moist.  Pharynx: Oropharynx is clear.  Eyes:     Conjunctiva/sclera: Conjunctivae normal.  Cardiovascular:     Rate and Rhythm: Normal rate and regular rhythm.     Heart sounds: Normal heart sounds.  Pulmonary:     Effort: Pulmonary effort is normal.     Breath sounds: Normal breath sounds.     Comments: Lungs clear to auscultation Musculoskeletal:     Cervical back: Neck supple.  Skin:    General: Skin is warm.  Neurological:     Mental Status: She is alert and oriented to person, place, and time.  Psychiatric:        Mood and Affect: Mood normal.        Behavior: Behavior normal.        Thought Content: Thought content normal.        Judgment:  Judgment normal.     Diagnostics: FVC 3.30 L, FEV1 2.44 L.  Predicted FVC 3.56 L, FEV1 2.73 L.  Spirometry indicates normal ventilatory function.  Assessment and Plan: 1. Moderate persistent asthma without complication   2. Allergic rhinitis with a nonallergic component   3. Chronic Eustachian tube dysfunction, left     No orders of the defined types were placed in this encounter.   Patient Instructions  Moderate persistent asthma Continue Flovent 110- 2 puffs twice a day with spacer to help prevent cough and wheeze May use albuterol 2 puffs every 4 hours as needed for cough, wheeze, tightness in chest, or shortness of breath. Asthma control goals:   Full participation in all desired activities (may need albuterol before activity)  Albuterol use two time or less a week on average (not counting use with activity)  Cough interfering with sleep two time or less a month  Oral steroids no more than once a year  No hospitalizations  Allergic rhinitis with a non-allergic component Stop Xyzal or Zyrtec Start carbinoxamine 4 mg- take one tablet twice a day as needed for drainage. Caution that this can make you drowsy Continue Xlerar (xylitol) or sinus rinse  Eustachian tube dysfunction She will do further research on which ENT she would like to see and will call our office if a referral is required.  Please let us know if this treatment plan is not working well for you. Schedule a follow up appointment in 5 months   Return in about 5 months (around 01/18/2020), or if symptoms worsen or fail to improve.    Thank you for the opportunity to care for this patient.  Please do not hesitate to contact me with questions.  Althea Charon, FNP [facility]

## 2019-08-28 ENCOUNTER — Telehealth: Payer: Self-pay

## 2019-08-28 ENCOUNTER — Other Ambulatory Visit: Payer: Self-pay | Admitting: *Deleted

## 2019-08-28 ENCOUNTER — Telehealth: Payer: Self-pay | Admitting: Obstetrics and Gynecology

## 2019-08-28 MED ORDER — CARBINOXAMINE MALEATE 4 MG PO TABS: 1.0000 | ORAL_TABLET | Freq: Two times a day (BID) | ORAL | 1 refills | Status: DC | PRN

## 2019-08-28 MED ORDER — FLOVENT HFA 110 MCG/ACT IN AERO: INHALATION_SPRAY | RESPIRATORY_TRACT | 1 refills | Status: DC

## 2019-08-28 NOTE — Telephone Encounter (Signed)
AEX 11/2018, next scheduled 11/2019 H/o vaginitis, perianal itching  Spoke with pt. Pt states having external vaginal itching for past 2 days due to swimming and being in bathing suit. Pt states had in past and was given Mycolog Rx. Pt states has used and resolving but out of date from 2019. Denies vaginal discharge, odor.   Pt requesting new Rx . Pt advised will review with Dr Talbert Nan and return call. Pt agreeable. Pharmacy verified. Pt states currently in [place] until mid Oct.   Routing to Dr Talbert Nan

## 2019-08-28 NOTE — Telephone Encounter (Signed)
She can try Vaseline externally, if significant itching she can try an over the counter steroid ointment.

## 2019-08-28 NOTE — Telephone Encounter (Signed)
Patient called to see if we can cancel the refills sent to her local pharmacy and change them over [facility]?  Thanks

## 2019-08-28 NOTE — Telephone Encounter (Signed)
Patient would like a prescription for nystatin. [facility] in [place] at [phone].

## 2019-08-28 NOTE — Telephone Encounter (Signed)
Prescriptions have been sent in a 90 day supply to [facility]. Called patient and advised. Patient verbalized understanding.

## 2019-08-29 MED ORDER — BETAMETHASONE VALERATE 0.1 % EX OINT
1.0000 | TOPICAL_OINTMENT | Freq: Two times a day (BID) | CUTANEOUS | 0 refills | Status: DC
Start: 2019-08-29 — End: 2019-11-12

## 2019-08-29 NOTE — Telephone Encounter (Signed)
Left detailed message per DPR. Pt made aware of sent Rx. Pt to return call if any questions or concerns.  Encounter closed.

## 2019-08-29 NOTE — Telephone Encounter (Signed)
I have called in a small amount of steroid ointment. I think this is the best choice for her. If she persists in having symptoms she should be seen.

## 2019-08-29 NOTE — Telephone Encounter (Signed)
Spoke with pt. Pt given recommendations per Dr Talbert Nan. Pt states has [employer] and will run out at end of month 09/02/19. Pt still requesting Rx mycolog cream.   Advised will review with Dr Talbert Nan and return call. Pt agreeable  Routing to Dr Talbert Nan

## 2019-11-03 NOTE — Progress Notes (Signed)
64 y.o. [id number] Married White or Caucasian Not Hispanic or Latino female here for annual exam.  No vaginal bleeding. Mild dyspareunia, uses a lubricant, tolerable.  No bowel or bladder issues.     Patient's last menstrual period was 01/03/2007 (approximate).          Sexually active: Yes.    The current method of family planning is post menopausal status.    Exercising: Yes.    walks Smoker:  no  Health Maintenance: Pap:  11/01/17  Neg , neg HPV  History of abnormal Pap:  no MMG:  11/12/18 Density Cat b : Birads 1 Neg  BMD:   2011 Colonoscopy: 06/03/06 Cologuard: negative 05/09/17 TDaP:  01/19/12 Gardasil: No   reports that she has never smoked. She has never used smokeless tobacco. She reports current alcohol use of about 5.0 standard drinks of alcohol per week. She reports that she does not use drugs. She is self employed, works in Mining engineer. 2 daughters, one in [place] one in [place]. No grandchildren.   Past Medical History:  Diagnosis Date  . Actinic keratosis 01/17/2008  . ALLERGIC RHINITIS 11/07/2006  . ASTHMA 11/07/2006  . Asthma   . COLONIC POLYPS, HX OF 11/07/2006  . DIVERTICULOSIS, COLON 11/07/2006  . Hypertension   . Recurrent upper respiratory infection (URI)   . Thyroid disease    hypothyroid    Past Surgical History:  Procedure Laterality Date  . CESAREAN SECTION      Current Outpatient Medications  Medication Sig Dispense Refill  . albuterol (VENTOLIN HFA) 108 (90 Base) MCG/ACT inhaler Inhale 1-2 puffs into the lungs every 4 (four) hours as needed for wheezing or shortness of breath. 54 g 1  . amLODipine (NORVASC) 2.5 MG tablet Take 2.5 mg by mouth daily.    Marland Kitchen amLODipine (NORVASC) 5 MG tablet Take 1 tablet (5 mg total) by mouth daily. 90 tablet 1  . betamethasone valerate ointment (VALISONE) 0.1 % Apply 1 application topically 2 (two) times daily. Can use for up to 2 weeks if needed. 15 g 0  . Calcium Carbonate-Vitamin D (CALTRATE 600+D) 600-400 MG-UNIT per tablet Take  1 tablet by mouth 2 (two) times daily.      . Carbinoxamine Maleate 4 MG TABS Take 1 tablet (4 mg total) by mouth 2 (two) times daily as needed. 180 tablet 1  . fluticasone (FLOVENT HFA) 110 MCG/ACT inhaler USE 2 INHALATIONS TWICE A DAY 3 each 1  . levocetirizine (XYZAL) 5 MG tablet Take 1 tablet (5 mg total) by mouth every evening. 90 tablet 0  . levothyroxine (SYNTHROID) 75 MCG tablet TAKE 1 TABLET DAILY 90 tablet 0  . losartan (COZAAR) 50 MG tablet TK 1 T PO QD    . Sodium Chloride-Xylitol (XLEAR SINUS CARE SPRAY NA) Place into the nose.     No current facility-administered medications for this visit.    Family History  Problem Relation Age of Onset  . Gout Father   . Cancer Father        bladder  . Arthritis Other   . Celiac disease Mother        sister  . Allergic rhinitis Mother   . Allergic rhinitis Sister   . Asthma Neg Hx   . Eczema Neg Hx   . Urticaria Neg Hx     Review of Systems  All other systems reviewed and are negative.   Exam:   LMP 01/03/2007 (Approximate)   Weight change: @WEIGHTCHANGE @ Height:  Ht Readings from Last 3 Encounters:  11/08/18 5' 5.5" (1.664 m)  11/07/18 5' 5.5" (1.664 m)  02/06/18 5\' 7"  (1.702 m)    General appearance: alert, cooperative and appears stated age Head: Normocephalic, without obvious abnormality, atraumatic Neck: no adenopathy, supple, symmetrical, trachea midline and thyroid normal to inspection and palpation Breasts: normal appearance, no masses or tenderness Abdomen: soft, non-tender; non distended,  no masses,  no organomegaly Extremities: extremities normal, atraumatic, no cyanosis or edema Skin: Skin color, texture, turgor normal. No rashes or lesions Lymph nodes: Cervical, supraclavicular, and axillary nodes normal. No abnormal inguinal nodes palpated Neurologic: Grossly normal   Pelvic: External genitalia:  no lesions              Urethra:  normal appearing urethra with no masses, tenderness or lesions               Bartholins and Skenes: normal                 Vagina: normal appearing vagina with normal color and discharge, no lesions              Cervix: no lesions               Bimanual Exam:  Uterus:  normal size, contour, position, consistency, mobility, non-tender              Adnexa: no mass, fullness, tenderness               Rectovaginal: Confirms               Anus:  normal sphincter tone, no lesions  Shanon Petty chaperoned for the exam.  A:  Well Woman with normal exam  P:   No pap this year  Mammogram due she will schedule  Cologuard due next   DEXA ordered  Discussed breast self exam  Discussed calcium and vit D intake  Labs with primary

## 2019-11-12 ENCOUNTER — Other Ambulatory Visit: Payer: Self-pay

## 2019-11-12 ENCOUNTER — Encounter: Payer: Self-pay | Admitting: Obstetrics and Gynecology

## 2019-11-12 ENCOUNTER — Other Ambulatory Visit: Payer: Self-pay | Admitting: Internal Medicine

## 2019-11-12 ENCOUNTER — Ambulatory Visit (INDEPENDENT_AMBULATORY_CARE_PROVIDER_SITE_OTHER): Payer: Medicare Other | Admitting: Obstetrics and Gynecology

## 2019-11-12 VITALS — BP 122/74 | HR 68 | Ht 65.25 in | Wt 201.0 lb

## 2019-11-12 DIAGNOSIS — Z01419 Encounter for gynecological examination (general) (routine) without abnormal findings: Secondary | ICD-10-CM | POA: Diagnosis not present

## 2019-11-12 DIAGNOSIS — Z780 Asymptomatic menopausal state: Secondary | ICD-10-CM

## 2019-11-12 DIAGNOSIS — Z1231 Encounter for screening mammogram for malignant neoplasm of breast: Secondary | ICD-10-CM

## 2019-11-12 NOTE — Patient Instructions (Signed)
EXERCISE AND DIET:  We recommended that you start or continue a regular exercise program for good health. Regular exercise means any activity that makes your heart beat faster and makes you sweat.  We recommend exercising at least 30 minutes per day at least 3 days a week, preferably 4 or 5.  We also recommend a diet low in fat and sugar.  Inactivity, poor dietary choices and obesity can cause diabetes, heart attack, stroke, and kidney damage, among others.    ALCOHOL AND SMOKING:  Women should limit their alcohol intake to no more than 7 drinks/beers/glasses of wine (combined, not each!) per week. Moderation of alcohol intake to this level decreases your risk of breast cancer and liver damage. And of course, no recreational drugs are part of a healthy lifestyle.  And absolutely no smoking or even second hand smoke. Most people know smoking can cause heart and lung diseases, but did you know it also contributes to weakening of your bones? Aging of your skin?  Yellowing of your teeth and nails?  CALCIUM AND VITAMIN D:  Adequate intake of calcium and Vitamin D are recommended.  The recommendations for exact amounts of these supplements seem to change often, but generally speaking 1,200 mg of calcium (between diet and supplement) and 800 units of Vitamin D per day seems prudent. Certain women may benefit from higher intake of Vitamin D.  If you are among these women, your doctor will have told you during your visit.    PAP SMEARS:  Pap smears, to check for cervical cancer or precancers,  have traditionally been done yearly, although recent scientific advances have shown that most women can have pap smears less often.  However, every woman still should have a physical exam from her gynecologist every year. It will include a breast check, inspection of the vulva and vagina to check for abnormal growths or skin changes, a visual exam of the cervix, and then an exam to evaluate the size and shape of the uterus and  ovaries.  And after 65 years of age, a rectal exam is indicated to check for rectal cancers. We will also provide age appropriate advice regarding health maintenance, like when you should have certain vaccines, screening for sexually transmitted diseases, bone density testing, colonoscopy, mammograms, etc.   MAMMOGRAMS:  All women over 40 years old should have a yearly mammogram. Many facilities now offer a "3D" mammogram, which may cost around $50 extra out of pocket. If possible,  we recommend you accept the option to have the 3D mammogram performed.  It both reduces the number of women who will be called back for extra views which then turn out to be normal, and it is better than the routine mammogram at detecting truly abnormal areas.    COLON CANCER SCREENING: Now recommend starting at age 45. At this time colonoscopy is not covered for routine screening until 50. There are take home tests that can be done between 45-49.   COLONOSCOPY:  Colonoscopy to screen for colon cancer is recommended for all women at age 50.  We know, you hate the idea of the prep.  We agree, BUT, having colon cancer and not knowing it is worse!!  Colon cancer so often starts as a polyp that can be seen and removed at colonscopy, which can quite literally save your life!  And if your first colonoscopy is normal and you have no family history of colon cancer, most women don't have to have it again for   10 years.  Once every ten years, you can do something that may end up saving your life, right?  We will be happy to help you get it scheduled when you are ready.  Be sure to check your insurance coverage so you understand how much it will cost.  It may be covered as a preventative service at no cost, but you should check your particular policy.      Breast Self-Awareness Breast self-awareness means being familiar with how your breasts look and feel. It involves checking your breasts regularly and reporting any changes to your  health care provider. Practicing breast self-awareness is important. A change in your breasts can be a sign of a serious medical problem. Being familiar with how your breasts look and feel allows you to find any problems early, when treatment is more likely to be successful. All women should practice breast self-awareness, including women who have had breast implants. How to do a breast self-exam One way to learn what is normal for your breasts and whether your breasts are changing is to do a breast self-exam. To do a breast self-exam: Look for Changes  1. Remove all the clothing above your waist. 2. Stand in front of a mirror in a room with good lighting. 3. Put your hands on your hips. 4. Push your hands firmly downward. 5. Compare your breasts in the mirror. Look for differences between them (asymmetry), such as: ? Differences in shape. ? Differences in size. ? Puckers, dips, and bumps in one breast and not the other. 6. Look at each breast for changes in your skin, such as: ? Redness. ? Scaly areas. 7. Look for changes in your nipples, such as: ? Discharge. ? Bleeding. ? Dimpling. ? Redness. ? A change in position. Feel for Changes Carefully feel your breasts for lumps and changes. It is best to do this while lying on your back on the floor and again while sitting or standing in the shower or tub with soapy water on your skin. Feel each breast in the following way:  Place the arm on the side of the breast you are examining above your head.  Feel your breast with the other hand.  Start in the nipple area and make  inch (2 cm) overlapping circles to feel your breast. Use the pads of your three middle fingers to do this. Apply light pressure, then medium pressure, then firm pressure. The light pressure will allow you to feel the tissue closest to the skin. The medium pressure will allow you to feel the tissue that is a little deeper. The firm pressure will allow you to feel the tissue  close to the ribs.  Continue the overlapping circles, moving downward over the breast until you feel your ribs below your breast.  Move one finger-width toward the center of the body. Continue to use the  inch (2 cm) overlapping circles to feel your breast as you move slowly up toward your collarbone.  Continue the up and down exam using all three pressures until you reach your armpit.  Write Down What You Find  Write down what is normal for each breast and any changes that you find. Keep a written record with breast changes or normal findings for each breast. By writing this information down, you do not need to depend only on memory for size, tenderness, or location. Write down where you are in your menstrual cycle, if you are still menstruating. If you are having trouble noticing differences   in your breasts, do not get discouraged. With time you will become more familiar with the variations in your breasts and more comfortable with the exam. How often should I examine my breasts? Examine your breasts every month. If you are breastfeeding, the best time to examine your breasts is after a feeding or after using a breast pump. If you menstruate, the best time to examine your breasts is 5-7 days after your period is over. During your period, your breasts are lumpier, and it may be more difficult to notice changes. When should I see my health care provider? See your health care provider if you notice:  A change in shape or size of your breasts or nipples.  A change in the skin of your breast or nipples, such as a reddened or scaly area.  Unusual discharge from your nipples.  A lump or thick area that was not there before.  Pain in your breasts.  Anything that concerns you.  

## 2019-11-13 ENCOUNTER — Ambulatory Visit
Admission: RE | Admit: 2019-11-13 | Discharge: 2019-11-13 | Disposition: A | Discharge status: Home / Self Care | Payer: Medicare Other | Source: Ambulatory Visit | Attending: Obstetrics and Gynecology | Admitting: Obstetrics and Gynecology

## 2019-11-13 ENCOUNTER — Ambulatory Visit
Admission: RE | Admit: 2019-11-13 | Discharge: 2019-11-13 | Disposition: A | Discharge status: Home / Self Care | Payer: Medicare Other | Source: Ambulatory Visit | Attending: Internal Medicine | Admitting: Internal Medicine

## 2019-11-13 DIAGNOSIS — Z1231 Encounter for screening mammogram for malignant neoplasm of breast: Secondary | ICD-10-CM

## 2019-11-13 DIAGNOSIS — Z780 Asymptomatic menopausal state: Secondary | ICD-10-CM

## 2019-11-16 ENCOUNTER — Ambulatory Visit: Payer: Medicare Other

## 2019-11-25 ENCOUNTER — Ambulatory Visit: Payer: BC Managed Care – PPO

## 2020-03-19 DIAGNOSIS — E559 Vitamin D deficiency, unspecified: Secondary | ICD-10-CM | POA: Insufficient documentation

## 2020-03-19 DIAGNOSIS — N952 Postmenopausal atrophic vaginitis: Secondary | ICD-10-CM | POA: Insufficient documentation

## 2020-05-25 DIAGNOSIS — J339 Nasal polyp, unspecified: Secondary | ICD-10-CM | POA: Insufficient documentation

## 2020-07-26 DIAGNOSIS — J324 Chronic pansinusitis: Secondary | ICD-10-CM | POA: Insufficient documentation

## 2020-11-01 ENCOUNTER — Other Ambulatory Visit: Payer: Self-pay | Admitting: Internal Medicine

## 2020-11-01 DIAGNOSIS — Z1231 Encounter for screening mammogram for malignant neoplasm of breast: Secondary | ICD-10-CM

## 2020-11-15 NOTE — Progress Notes (Deleted)
65 y.o. [id number] Married White or Caucasian Not Hispanic or Latino female here for annual exam.      Patient's last menstrual period was 01/03/2007 (approximate).          Sexually active: {yes no:314532}  The current method of family planning is {contraception:315051}.    Exercising: {yes no:314532}  {types:19826} Smoker:  {YES P5382123  Health Maintenance: Pap:  11/01/17  Neg , neg HPV 12/01/14  History of abnormal Pap:  {YES NO:22349} MMG:  11/14/19 density B Bi-rads 1 neg  BMD:   11/13/19 normal t-score -0.4 Colonoscopy: Colonoscopy: 06/03/06 Cologuard: negative 05/09/17 TDaP:  01/19/12 Gardasil: No    reports that she has never smoked. She has never used smokeless tobacco. She reports current alcohol use of about 5.0 standard drinks per week. She reports that she does not use drugs.  Past Medical History:  Diagnosis Date   Actinic keratosis 01/17/2008   ALLERGIC RHINITIS 11/07/2006   ASTHMA 11/07/2006   Asthma    COLONIC POLYPS, HX OF 11/07/2006   DIVERTICULOSIS, COLON 11/07/2006   Hypertension    Recurrent upper respiratory infection (URI)    Thyroid disease    hypothyroid    Past Surgical History:  Procedure Laterality Date   CESAREAN SECTION      Current Outpatient Medications  Medication Sig Dispense Refill   albuterol (VENTOLIN HFA) 108 (90 Base) MCG/ACT inhaler Inhale 1-2 puffs into the lungs every 4 (four) hours as needed for wheezing or shortness of breath. 54 g 1   amLODipine (NORVASC) 2.5 MG tablet Take 2.5 mg by mouth daily.     Calcium Carbonate-Vitamin D (CALTRATE 600+D) 600-400 MG-UNIT per tablet Take 1 tablet by mouth 2 (two) times daily.       fluticasone (FLOVENT HFA) 110 MCG/ACT inhaler USE 2 INHALATIONS TWICE A DAY 3 each 1   levothyroxine (SYNTHROID) 75 MCG tablet TAKE 1 TABLET DAILY 90 tablet 0   losartan (COZAAR) 50 MG tablet TK 1 T PO QD     No current facility-administered medications for this visit.    Family History  Problem Relation Age of  Onset   Gout Father    Cancer Father        bladder   Arthritis Other    Celiac disease Mother        sister   Allergic rhinitis Mother    Allergic rhinitis Sister    Asthma Neg Hx    Eczema Neg Hx    Urticaria Neg Hx     Review of Systems  Exam:   LMP 01/03/2007 (Approximate)   Weight change: @WEIGHTCHANGE @ Height:      Ht Readings from Last 3 Encounters:  11/12/19 5' 5.25" (1.657 m)  11/08/18 5' 5.5" (1.664 m)  11/07/18 5' 5.5" (1.664 m)    General appearance: alert, cooperative and appears stated age Head: Normocephalic, without obvious abnormality, atraumatic Neck: no adenopathy, supple, symmetrical, trachea midline and thyroid {CHL AMB PHY EX THYROID NORM [phone]::"normal to inspection and palpation"} Lungs: clear to auscultation bilaterally Cardiovascular: regular rate and rhythm Breasts: {Exam; breast:13139::"normal appearance, no masses or tenderness"} Abdomen: soft, non-tender; non distended,  no masses,  no organomegaly Extremities: extremities normal, atraumatic, no cyanosis or edema Skin: Skin color, texture, turgor normal. No rashes or lesions Lymph nodes: Cervical, supraclavicular, and axillary nodes normal. No abnormal inguinal nodes palpated Neurologic: Grossly normal   Pelvic: External genitalia:  no lesions              Urethra:  normal appearing urethra with no masses, tenderness or lesions              Bartholins and Skenes: normal                 Vagina: normal appearing vagina with normal color and discharge, no lesions              Cervix: {CHL AMB PHY EX CERVIX NORM DEFAULT:[id number]::"no lesions"}               Bimanual Exam:  Uterus:  {CHL AMB PHY EX UTERUS NORM [phone]::"normal size, contour, position, consistency, mobility, non-tender"}              Adnexa: {CHL AMB PHY EX ADNEXA NO MASS DEFAULT:[phone]::"no mass, fullness, tenderness"}               Rectovaginal: Confirms               Anus:  normal sphincter tone,  no lesions  *** chaperoned for the exam.  A:  Well Woman with normal exam  P:

## 2020-11-16 ENCOUNTER — Other Ambulatory Visit: Payer: Self-pay

## 2020-11-16 ENCOUNTER — Ambulatory Visit
Admission: RE | Admit: 2020-11-16 | Discharge: 2020-11-16 | Disposition: A | Discharge status: Home / Self Care | Payer: Medicare Other | Source: Ambulatory Visit | Attending: Internal Medicine | Admitting: Internal Medicine

## 2020-11-16 DIAGNOSIS — Z1231 Encounter for screening mammogram for malignant neoplasm of breast: Secondary | ICD-10-CM

## 2020-11-18 ENCOUNTER — Ambulatory Visit: Payer: Medicare Other | Admitting: Obstetrics and Gynecology

## 2021-03-14 NOTE — Progress Notes (Unsigned)
66 y.o. [id number] Married White or Caucasian Not Hispanic or Latino female here for annual exam.      Patient's last menstrual period was 01/03/2007 (approximate).          Sexually active: {yes no:314532}  The current method of family planning is {contraception:315051}.    Exercising: {yes no:314532}  {types:19826} Smoker:  {YES P5382123  Health Maintenance: Pap:   11/01/17  Neg , neg HPV  History of abnormal Pap:  no MMG:  11/16/20 density B Bi-rads 1 neg  BMD:   11/13/10 normal  Colonoscopy: 2008  TDaP:  01/19/12  Gardasil: none    reports that she has never smoked. She has never used smokeless tobacco. She reports current alcohol use of about 5.0 standard drinks per week. She reports that she does not use drugs.  Past Medical History:  Diagnosis Date   Actinic keratosis 01/17/2008   ALLERGIC RHINITIS 11/07/2006   ASTHMA 11/07/2006   Asthma    COLONIC POLYPS, HX OF 11/07/2006   DIVERTICULOSIS, COLON 11/07/2006   Hypertension    Recurrent upper respiratory infection (URI)    Thyroid disease    hypothyroid    Past Surgical History:  Procedure Laterality Date   CESAREAN SECTION      Current Outpatient Medications  Medication Sig Dispense Refill   albuterol (VENTOLIN HFA) 108 (90 Base) MCG/ACT inhaler Inhale 1-2 puffs into the lungs every 4 (four) hours as needed for wheezing or shortness of breath. 54 g 1   amLODipine (NORVASC) 2.5 MG tablet Take 2.5 mg by mouth daily.     Calcium Carbonate-Vitamin D (CALTRATE 600+D) 600-400 MG-UNIT per tablet Take 1 tablet by mouth 2 (two) times daily.       fluticasone (FLOVENT HFA) 110 MCG/ACT inhaler USE 2 INHALATIONS TWICE A DAY 3 each 1   levothyroxine (SYNTHROID) 75 MCG tablet TAKE 1 TABLET DAILY 90 tablet 0   losartan (COZAAR) 50 MG tablet TK 1 T PO QD     No current facility-administered medications for this visit.    Family History  Problem Relation Age of Onset   Gout Father    Cancer Father        bladder   Arthritis Other     Celiac disease Mother        sister   Allergic rhinitis Mother    Allergic rhinitis Sister    Asthma Neg Hx    Eczema Neg Hx    Urticaria Neg Hx     Review of Systems  Exam:   LMP 01/03/2007 (Approximate)   Weight change: '@WEIGHTCHANGE'$ @ Height:      Ht Readings from Last 3 Encounters:  11/12/19 5' 5.25" (1.657 m)  11/08/18 5' 5.5" (1.664 m)  11/07/18 5' 5.5" (1.664 m)    General appearance: alert, cooperative and appears stated age Head: Normocephalic, without obvious abnormality, atraumatic Neck: no adenopathy, supple, symmetrical, trachea midline and thyroid {CHL AMB PHY EX THYROID NORM DEFAULT:[id number]::"normal to inspection and palpation"} Lungs: clear to auscultation bilaterally Cardiovascular: regular rate and rhythm Breasts: {Exam; breast:13139::"normal appearance, no masses or tenderness"} Abdomen: soft, non-tender; non distended,  no masses,  no organomegaly Extremities: extremities normal, atraumatic, no cyanosis or edema Skin: Skin color, texture, turgor normal. No rashes or lesions Lymph nodes: Cervical, supraclavicular, and axillary nodes normal. No abnormal inguinal nodes palpated Neurologic: Grossly normal   Pelvic: External genitalia:  no lesions              Urethra:  normal appearing urethra with  no masses, tenderness or lesions              Bartholins and Skenes: normal                 Vagina: normal appearing vagina with normal color and discharge, no lesions              Cervix: {CHL AMB PHY EX CERVIX NORM [phone]::"no lesions"}               Bimanual Exam:  Uterus:  {CHL AMB PHY EX UTERUS NORM DEFAULT:[phone]::"normal size, contour, position, consistency, mobility, non-tender"}              Adnexa: {CHL AMB PHY EX ADNEXA NO MASS DEFAULT:[phone]::"no mass, fullness, tenderness"}               Rectovaginal: Confirms               Anus:  normal sphincter tone, no lesions  *** chaperoned for the exam.  A:  Well Woman with normal  exam  P:

## 2021-03-21 ENCOUNTER — Ambulatory Visit (INDEPENDENT_AMBULATORY_CARE_PROVIDER_SITE_OTHER): Payer: Medicare Other | Admitting: Obstetrics and Gynecology

## 2021-03-21 ENCOUNTER — Encounter: Payer: Self-pay | Admitting: Obstetrics and Gynecology

## 2021-03-21 ENCOUNTER — Other Ambulatory Visit: Payer: Self-pay

## 2021-03-21 ENCOUNTER — Other Ambulatory Visit (HOSPITAL_COMMUNITY)
Admission: RE | Admit: 2021-03-21 | Discharge: 2021-03-21 | Disposition: A | Discharge status: Home / Self Care | Payer: Medicare Other | Source: Ambulatory Visit | Attending: Obstetrics and Gynecology | Admitting: Obstetrics and Gynecology

## 2021-03-21 VITALS — BP 132/80 | HR 67 | Ht 66.0 in | Wt 197.0 lb

## 2021-03-21 DIAGNOSIS — L649 Androgenic alopecia, unspecified: Secondary | ICD-10-CM | POA: Insufficient documentation

## 2021-03-21 DIAGNOSIS — J45909 Unspecified asthma, uncomplicated: Secondary | ICD-10-CM | POA: Insufficient documentation

## 2021-03-21 DIAGNOSIS — Z860100 Personal history of colon polyps, unspecified: Secondary | ICD-10-CM | POA: Insufficient documentation

## 2021-03-21 DIAGNOSIS — L578 Other skin changes due to chronic exposure to nonionizing radiation: Secondary | ICD-10-CM | POA: Insufficient documentation

## 2021-03-21 DIAGNOSIS — Z85828 Personal history of other malignant neoplasm of skin: Secondary | ICD-10-CM | POA: Insufficient documentation

## 2021-03-21 DIAGNOSIS — Z124 Encounter for screening for malignant neoplasm of cervix: Secondary | ICD-10-CM

## 2021-03-21 DIAGNOSIS — Z01419 Encounter for gynecological examination (general) (routine) without abnormal findings: Secondary | ICD-10-CM

## 2021-03-21 DIAGNOSIS — L719 Rosacea, unspecified: Secondary | ICD-10-CM | POA: Insufficient documentation

## 2021-03-21 DIAGNOSIS — D2370 Other benign neoplasm of skin of unspecified lower limb, including hip: Secondary | ICD-10-CM | POA: Insufficient documentation

## 2021-03-21 DIAGNOSIS — E669 Obesity, unspecified: Secondary | ICD-10-CM | POA: Insufficient documentation

## 2021-03-21 NOTE — Patient Instructions (Signed)

## 2021-03-22 LAB — CYTOLOGY - PAP: Diagnosis: NEGATIVE

## 2021-12-12 ENCOUNTER — Ambulatory Visit
Admission: RE | Admit: 2021-12-12 | Discharge: 2021-12-12 | Disposition: A | Discharge status: Home / Self Care | Payer: Medicare Other | Source: Ambulatory Visit | Attending: Internal Medicine | Admitting: Internal Medicine

## 2021-12-12 ENCOUNTER — Other Ambulatory Visit: Payer: Self-pay | Admitting: Internal Medicine

## 2021-12-12 DIAGNOSIS — Z1231 Encounter for screening mammogram for malignant neoplasm of breast: Secondary | ICD-10-CM

## 2021-12-13 ENCOUNTER — Ambulatory Visit (INDEPENDENT_AMBULATORY_CARE_PROVIDER_SITE_OTHER): Payer: Medicare Other | Admitting: Internal Medicine

## 2021-12-13 ENCOUNTER — Encounter: Payer: Self-pay | Admitting: Internal Medicine

## 2021-12-13 VITALS — BP 124/76 | HR 70 | Temp 98.8°F | Resp 16 | Ht 65.5 in | Wt 199.0 lb

## 2021-12-13 DIAGNOSIS — J330 Polyp of nasal cavity: Secondary | ICD-10-CM

## 2021-12-13 DIAGNOSIS — J4540 Moderate persistent asthma, uncomplicated: Secondary | ICD-10-CM

## 2021-12-13 DIAGNOSIS — J3089 Other allergic rhinitis: Secondary | ICD-10-CM | POA: Diagnosis not present

## 2021-12-13 MED ORDER — MONTELUKAST SODIUM 10 MG PO TABS: 10.0000 mg | ORAL_TABLET | Freq: Every day | ORAL | 1 refills | Status: AC

## 2021-12-13 MED ORDER — XHANCE 93 MCG/ACT NA EXHU: 1.0000 | INHALANT_SUSPENSION | Freq: Every day | NASAL | 5 refills | Status: AC

## 2021-12-13 MED ORDER — BUDESONIDE 180 MCG/ACT IN AEPB: 2.0000 | INHALATION_SPRAY | Freq: Every day | RESPIRATORY_TRACT | 6 refills | Status: AC

## 2021-12-13 NOTE — Patient Instructions (Signed)
Allergic Rhinitis with nasal polyps: -Continue allergy avoidance to mold - consider allergy shots as long term control of your symptoms by teaching your immune system to be more tolerant of your allergy triggers - Start Nasal Steroid Spray: Xhance 1 spray per nostril daily - Start Singulair (Montelukast) '10mg'$  nightly. - Continue nasal sinus rinses daily  - If poorly controlled we can discuss biologic therapy with dupixent which can prevent surgery   Xhance copay  https://xhancecopay.com/download  Xhnace instructions https://www.RoleLink.com.br.pdf  Moderate persistent asthma: Well controlled - Breathing test today showed: looked okay - Due to IT trainer, Medicare will not longer cover Flovent   PLAN:  - Spacer not needed with current regimen. - Daily controller medication(s): Singulair '10mg'$  daily and Pulmicort Flexhaler 124mg 2 puffs once daily - Prior to physical activity: albuterol 2 puffs 10-15 minutes before physical activity. - Rescue medications: albuterol 4 puffs every 4-6 hours as needed - Asthma control goals:  * Full participation in all desired activities (may need albuterol before activity) * Albuterol use two time or less a week on average (not counting use with activity) * Cough interfering with sleep two time or less a month * Oral steroids no more than once a year * No hospitalizations  Follow up: 6 months   Thank you so much for letting me partake in your care today.  Don't hesitate to reach out if you have any additional concerns!  ERoney Marion MD  [facility]

## 2021-12-13 NOTE — Progress Notes (Signed)
Follow Up Note  RE: Jasmin Small MRN: [id number] DOB: 01-08-1955 Date of Office Visit: 12/13/2021  Referring provider: Josetta Huddle, MD Primary care provider: Josetta Huddle, MD  Chief Complaint: Allergies  History of Present Illness: I had the pleasure of seeing Jasmin Small for a follow up visit at the [facility] on 12/13/2021. She is a 66 y.o. female, who is being followed for persistent asthma and allergic rhinitis, eustachian tube disfunction. Her previous allergy office visit was on 08/18/19 with Althea Charon FNP. Today is a regular follow up visit.  History obtained from patient, chart review.  Since last visit she has seen ENT and was diagnosed with nasal polyposis.  She has not had surgery, but was treated with prednisone and antibiotics with good effect. She does require prednisone yearly for flares.    She no longer takes zyrtec daily and has not noticed a difference in symptoms.  Currently on saline for nasal spray and an INS (unsure brand) which she uses intermittently. She does have a sense of smell and taste.    She is on flovent 155mg 2 puffs once a day with good control. Denies any OCS, ABX, ED, UC visits since last visit for low respiratory infections.  She is concerned because she was notified by Medicare that Flovent brand would not be covered by her current insurance and is interested in other options.  She is also interested if we could write a letter in support of Flovent.  Pertinent History/Diagnostics:  - Asthma: moderate persistent  - normal spirometry (8/1/621): ratio 74, 2.44L, 99% FEV1 (pre),   -CXR 03/09/2014: Lingula versus bilateral bronchopneumonia. No pleural effusion. - AEC 200 (12/06/2017)  - Allergic Rhinitis:   - SPT environmental panel (11/19/2017): Intradermal positive to mold mix 3    Assessment and Plan: BTishinais a 66y.o. female with: Moderate persistent asthma without complication  Polyp of nasal cavity  Other  allergic rhinitis Plan: Patient Instructions  Allergic Rhinitis with nasal polyps: -Continue allergy avoidance to mold - consider allergy shots as long term control of your symptoms by teaching your immune system to be more tolerant of your allergy triggers - Start Nasal Steroid Spray: Xhance 1 spray per nostril daily - Start Singulair (Montelukast) '10mg'$  nightly. - Continue nasal sinus rinses daily  - If poorly controlled we can discuss biologic therapy with dupixent which can prevent surgery   Xhance copay  https://xhancecopay.com/download  Xhnace instructions https://www.xRoleLink.com.brpdf  Moderate persistent asthma: Well controlled - Breathing test today showed: looked okay - Due to mIT trainer Medicare will not longer cover Flovent   PLAN:  - Spacer not needed with current regimen. - Daily controller medication(s): Singulair '10mg'$  daily and Pulmicort Flexhaler 1889m 2 puffs once daily - Prior to physical activity: albuterol 2 puffs 10-15 minutes before physical activity. - Rescue medications: albuterol 4 puffs every 4-6 hours as needed - Asthma control goals:  * Full participation in all desired activities (may need albuterol before activity) * Albuterol use two time or less a week on average (not counting use with activity) * Cough interfering with sleep two time or less a month * Oral steroids no more than once a year * No hospitalizations  Follow up: 6 months   Thank you so much for letting me partake in your care today.  Don't hesitate to reach out if you have any additional concerns!  EvRoney MarionMD  [facility]  No follow-ups on file.  Meds ordered this encounter  Medications   Fluticasone Propionate (XHANCE) 93 MCG/ACT EXHU    Sig: Place 1 spray into the nose daily.    Dispense:  16 mL    Refill:  5   budesonide (PULMICORT) 180 MCG/ACT inhaler    Sig: Inhale 2 puffs into the  lungs daily.    Dispense:  1 each    Refill:  6   montelukast (SINGULAIR) 10 MG tablet    Sig: Take 1 tablet (10 mg total) by mouth at bedtime.    Dispense:  90 tablet    Refill:  1    Lab Orders  No laboratory test(s) ordered today   Diagnostics: Spirometry:  Tracings reviewed. Her effort: Good reproducible efforts. FVC: 3.00 L FEV1: 1.95 L, 80% predicted FEV1/FVC ratio: 73% Interpretation: Spirometry consistent with normal pattern.  Please see scanned spirometry results for details.   Results interpreted by myself during this encounter and discussed with patient/family.   Medication List:  Current Outpatient Medications  Medication Sig Dispense Refill   albuterol (VENTOLIN HFA) 108 (90 Base) MCG/ACT inhaler Inhale 1-2 puffs into the lungs every 4 (four) hours as needed for wheezing or shortness of breath. 54 g 1   amLODipine (NORVASC) 2.5 MG tablet Take 2.5 mg by mouth daily.     budesonide (PULMICORT) 180 MCG/ACT inhaler Inhale 2 puffs into the lungs daily. 1 each 6   Calcium Carbonate-Vitamin D 600-400 MG-UNIT tablet Take 1 tablet by mouth 2 (two) times daily.       Fluticasone Propionate (XHANCE) 93 MCG/ACT EXHU Place 1 spray into the nose daily. 16 mL 5   levothyroxine (SYNTHROID) 75 MCG tablet TAKE 1 TABLET DAILY 90 tablet 0   losartan (COZAAR) 50 MG tablet TK 1 T PO QD     montelukast (SINGULAIR) 10 MG tablet Take 1 tablet (10 mg total) by mouth at bedtime. 90 tablet 1   zolpidem (AMBIEN) 10 MG tablet Take 10 mg by mouth at bedtime as needed.     No current facility-administered medications for this visit.   Allergies: No Known Allergies I reviewed her past medical history, social history, family history, and environmental history and no significant changes have been reported from her previous visit.  ROS: All others negative except as noted per HPI.   Objective: BP 124/76   Pulse 70   Temp 98.8 F (37.1 C) (Temporal)   Resp 16   Ht 5' 5.5" (1.664 m)   Wt  199 lb (90.3 kg)   LMP 01/03/2007 (Approximate)   SpO2 96%   BMI 32.61 kg/m  Body mass index is 32.61 kg/m. General Appearance:  Alert, cooperative, no distress, appears stated age  Head:  Normocephalic, without obvious abnormality, atraumatic  Eyes:  Conjunctiva clear, EOM's intact  Nose: Nares normal,  nasal polyposis bilaterally with left greater than right, hypertrophic turbinates, and septum midline  Throat: Lips, tongue normal; teeth and gums normal, no tonsillar exudate and + cobblestoning  Neck: Supple, symmetrical  Lungs:   clear to auscultation bilaterally, Respirations unlabored, no coughing  Heart:  regular rate and rhythm and no murmur, Appears well perfused  Extremities: No edema  Skin: Skin color, texture, turgor normal, no rashes or lesions on visualized portions of skin   Neurologic: No gross deficits   Previous notes and tests were reviewed. The plan was reviewed with the patient/family, and all questions/concerned were addressed.  It was my pleasure to see Jasmin Small today and  participate in her care. Please feel free to contact me with any questions or concerns.  Sincerely,  Roney Marion, MD  Allergy & Immunology  [facility] [place] [facility] Office: [phone]

## 2021-12-14 NOTE — Addendum Note (Signed)
Addended by: Felipa Emory on: 12/14/2021 08:25 AM   Modules accepted: Orders

## 2022-12-04 ENCOUNTER — Other Ambulatory Visit: Payer: Self-pay | Admitting: Internal Medicine

## 2022-12-04 DIAGNOSIS — Z1231 Encounter for screening mammogram for malignant neoplasm of breast: Secondary | ICD-10-CM

## 2022-12-14 ENCOUNTER — Ambulatory Visit
Admission: RE | Admit: 2022-12-14 | Discharge: 2022-12-14 | Disposition: A | Discharge status: Home / Self Care | Payer: Medicare Other | Source: Ambulatory Visit | Attending: Internal Medicine | Admitting: Internal Medicine

## 2022-12-14 DIAGNOSIS — Z1231 Encounter for screening mammogram for malignant neoplasm of breast: Secondary | ICD-10-CM

## 2023-02-19 IMAGING — MG MM DIGITAL SCREENING BILAT W/ TOMO AND CAD
8 series · 8 of 24 positions shown · non-contrast
Comparison: Previous exam(s).

CLINICAL DATA: Screening.

EXAM:
DIGITAL SCREENING BILATERAL MAMMOGRAM WITH TOMOSYNTHESIS AND CAD
TECHNIQUE: Bilateral screening digital craniocaudal and mediolateral oblique
mammograms were obtained. Bilateral screening digital breast
tomosynthesis was performed. The images were evaluated with
computer-aided detection.

[R CC synth-2D]
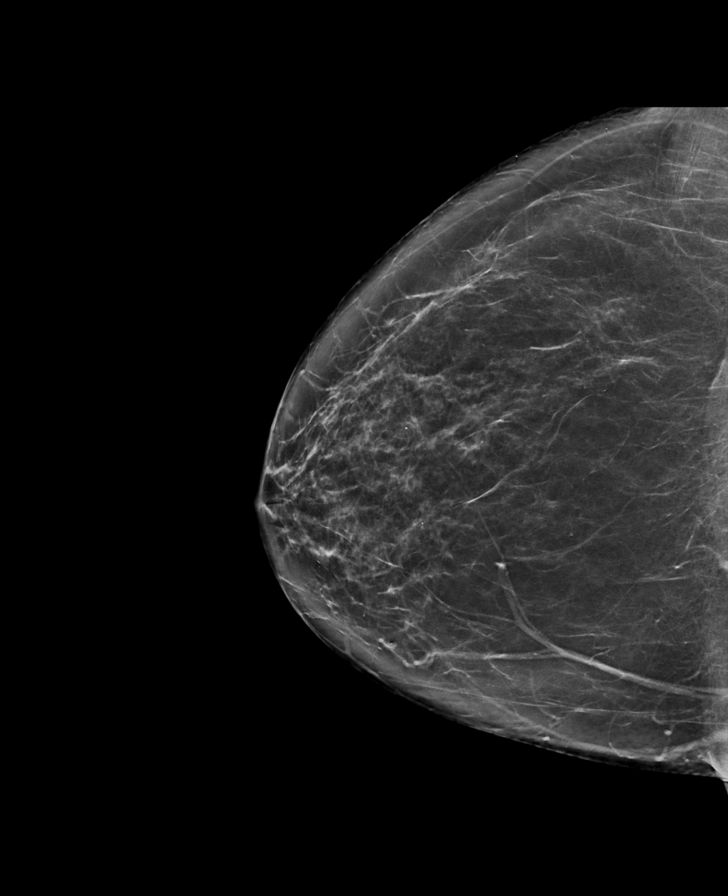

[L CC synth-2D]
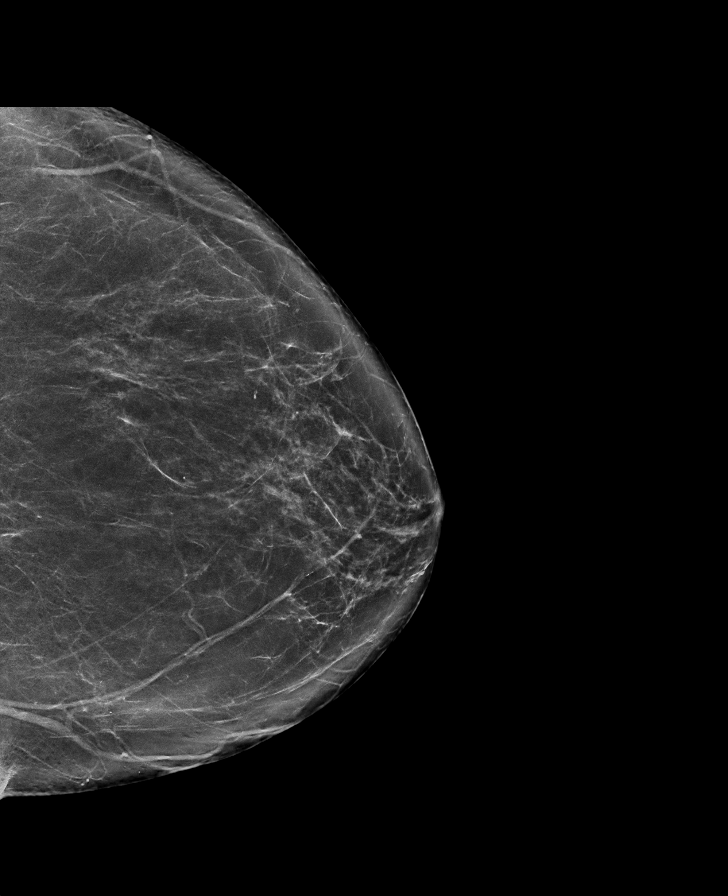

[L MLO synth-2D]
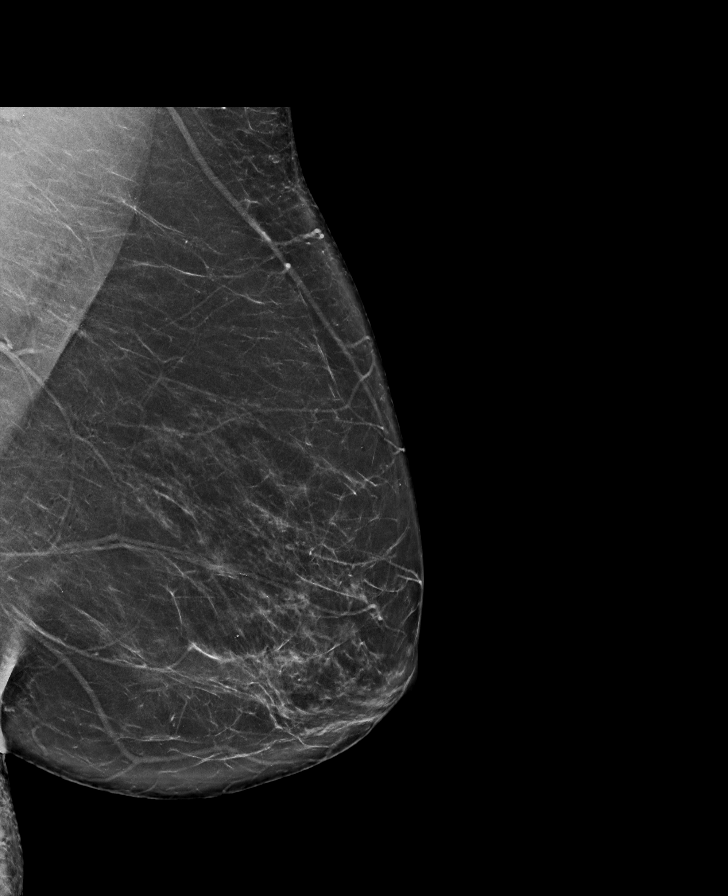

[R MLO synth-2D]
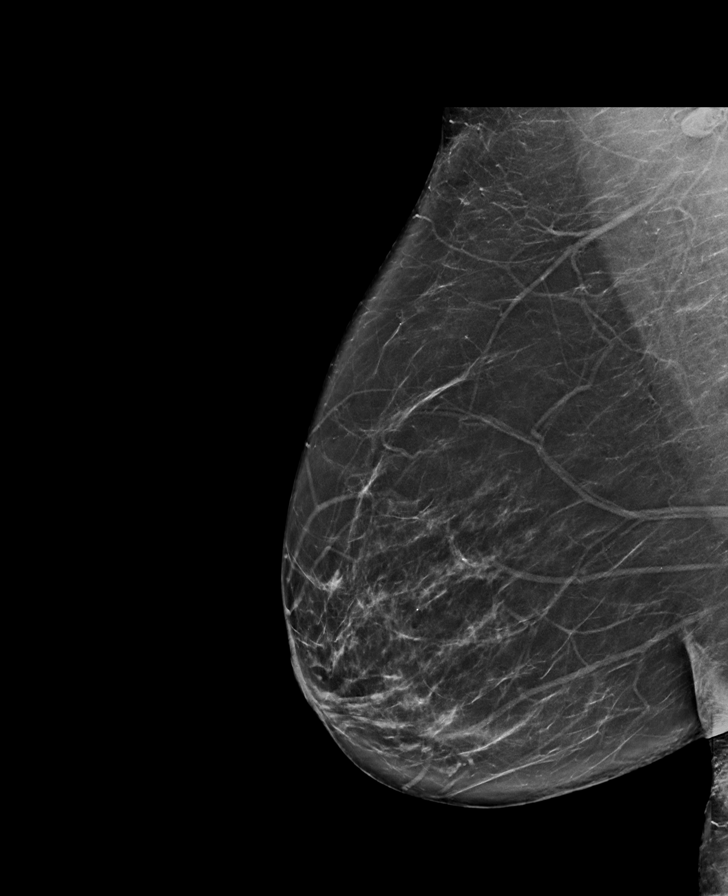

[L CC tomo · tomo slice 41/82.0]
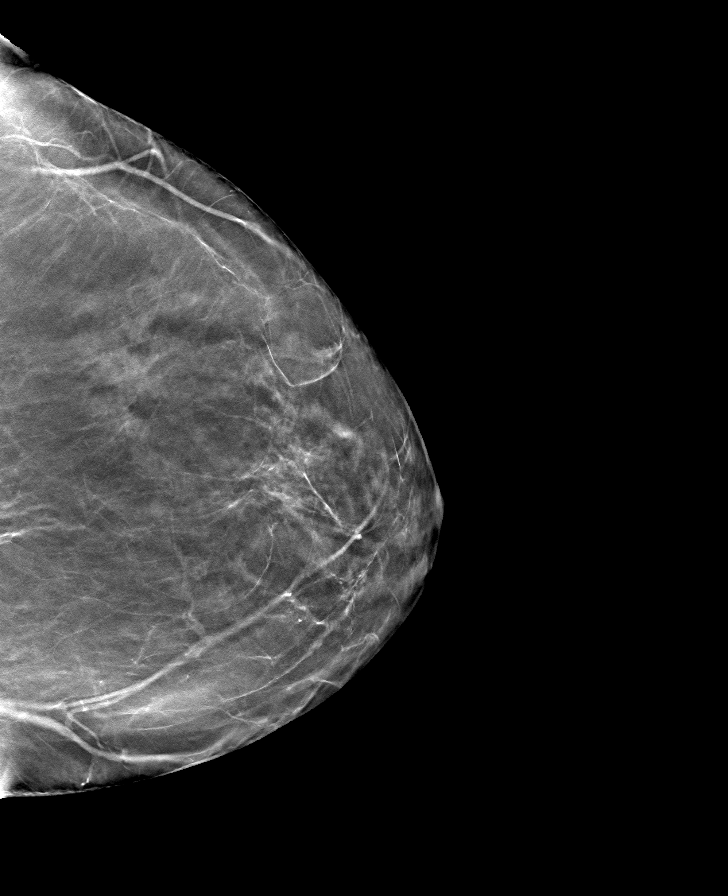

[R MLO tomo · tomo slice 41/81.0]
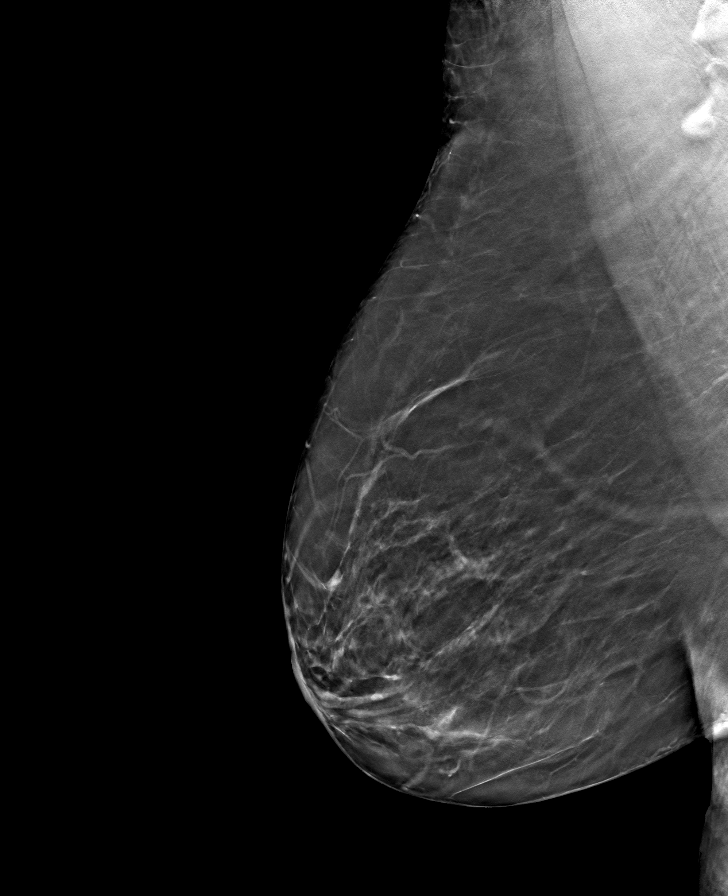

[R CC tomo · tomo slice 39/77.0]
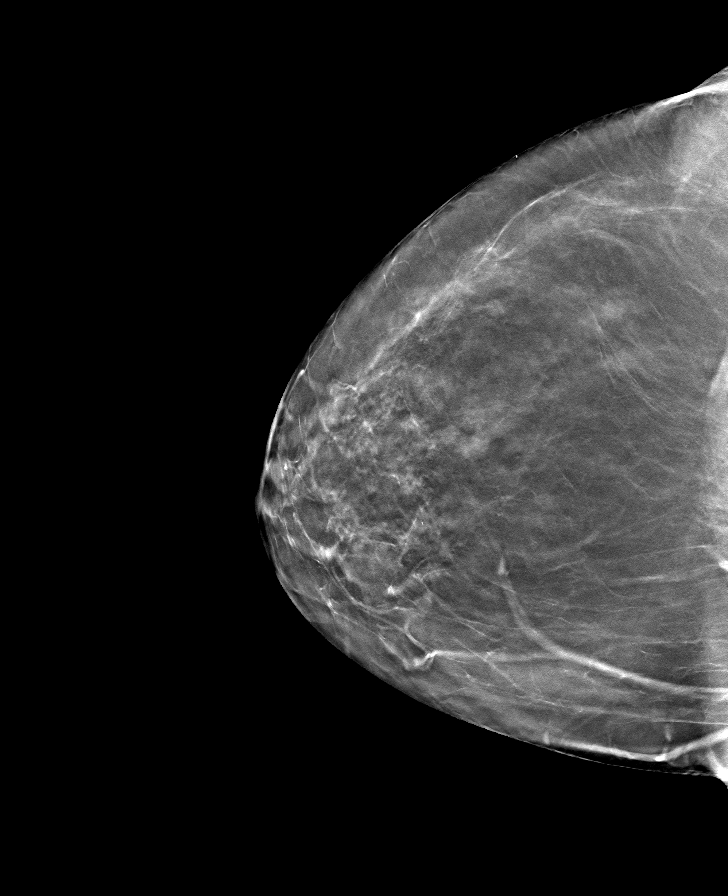

[L MLO tomo · tomo slice 43/85.0]
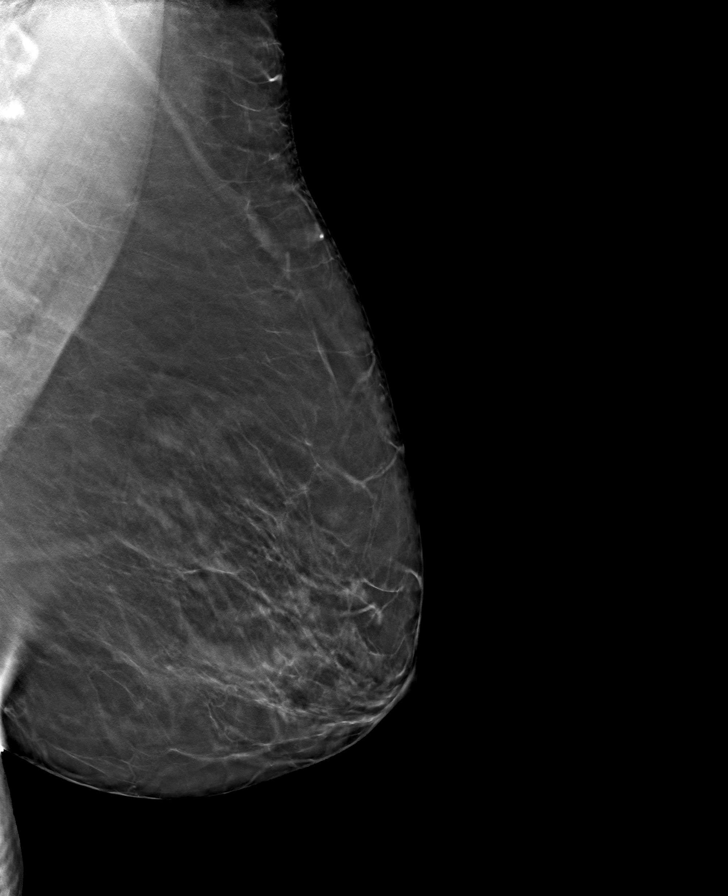

[8 of 24 positions shown; findings below may reference images not displayed]

ACR Breast Density Category b: There are scattered areas of
fibroglandular density.
FINDINGS: There are no findings suspicious for malignancy.
IMPRESSION: No mammographic evidence of malignancy. A result letter of this
screening mammogram will be mailed directly to the patient.

RECOMMENDATION:
Screening mammogram in one year. (Code:51-O-LD2)

BI-RADS CATEGORY  1: Negative.

## 2023-04-18 ENCOUNTER — Other Ambulatory Visit (HOSPITAL_COMMUNITY): Payer: Self-pay | Admitting: Internal Medicine

## 2023-04-18 DIAGNOSIS — E7800 Pure hypercholesterolemia, unspecified: Secondary | ICD-10-CM

## 2023-05-02 ENCOUNTER — Ambulatory Visit (HOSPITAL_COMMUNITY)
Admission: RE | Admit: 2023-05-02 | Discharge: 2023-05-02 | Disposition: A | Discharge status: Home / Self Care | Payer: Self-pay | Source: Ambulatory Visit | Attending: Internal Medicine | Admitting: Internal Medicine

## 2023-05-02 DIAGNOSIS — E7800 Pure hypercholesterolemia, unspecified: Secondary | ICD-10-CM | POA: Insufficient documentation

## 2023-07-11 ENCOUNTER — Encounter: Payer: Self-pay | Admitting: Obstetrics and Gynecology

## 2023-07-11 ENCOUNTER — Ambulatory Visit (INDEPENDENT_AMBULATORY_CARE_PROVIDER_SITE_OTHER): Admitting: Obstetrics and Gynecology

## 2023-07-11 VITALS — BP 152/90 | HR 84 | Ht 65.0 in | Wt 199.0 lb

## 2023-07-11 DIAGNOSIS — Z01419 Encounter for gynecological examination (general) (routine) without abnormal findings: Secondary | ICD-10-CM | POA: Diagnosis not present

## 2023-07-11 DIAGNOSIS — Z1331 Encounter for screening for depression: Secondary | ICD-10-CM

## 2023-07-11 DIAGNOSIS — E2839 Other primary ovarian failure: Secondary | ICD-10-CM

## 2023-07-11 DIAGNOSIS — N952 Postmenopausal atrophic vaginitis: Secondary | ICD-10-CM

## 2023-07-11 MED ORDER — INTRAROSA 6.5 MG VA INST: 1.0000 | VAGINAL_INSERT | Freq: Every evening | VAGINAL | 12 refills | Status: AC | PRN

## 2023-07-11 NOTE — Progress Notes (Signed)
 68 y.o. y.o. female here for annual exam. Patient's last menstrual period was 01/03/2007 (approximate).  Worked in [employer] and relocated here. Has 2 grandchildren.                                                       [name] Married White or Caucasian Not Hispanic or Latino female here for breast and pelvic exam.    No vaginal bleeding. No bowel or bladder c/o. Some dryness and dyspareunia. Tried estrace  cream and did not like it, helped with lubrication. Declines vaginal estrogen. To try intrarosa   Patient's last menstrual period was 01/03/2007 (approximate).          Sexually active: Yes.    The current method of family planning is post menopausal status.    Exercising: Yes.    Walking, horseback riding, Skiing Smoker:  no  Health Maintenance: Pap:   11/01/17  Neg , neg HPV  History of abnormal Pap:  no MMG:  12/24 at breast center BMD:   11/13/19 normal repeat ordered at [facility] Colonoscopy: 12/22 normal, f/u in 10 years. With Dr. Saintclair at [place] to get records for epic TDaP:  01/19/12  Gardasil: none   Body mass index is 33.12 kg/m.     07/11/2023    9:41 AM 09/21/2016    9:06 AM  Depression screen PHQ 2/9  Decreased Interest 0 0  Down, Depressed, Hopeless 0 0  PHQ - 2 Score 0 0    Blood pressure (!) 152/90, pulse 84, height 5' 5 (1.651 m), weight 199 lb (90.3 kg), last menstrual period 01/03/2007, SpO2 97%.     Component Value Date/Time   DIAGPAP  03/21/2021 1351    - Negative for intraepithelial lesion or malignancy (NILM)   DIAGPAP  11/01/2017 0000    NEGATIVE FOR INTRAEPITHELIAL LESIONS OR MALIGNANCY.   ADEQPAP  03/21/2021 1351    Satisfactory for evaluation; transformation zone component PRESENT.   ADEQPAP  11/01/2017 0000    Satisfactory for evaluation  endocervical/transformation zone component PRESENT.    GYN HISTORY:    Component Value Date/Time   DIAGPAP  03/21/2021 1351    - Negative for intraepithelial lesion or malignancy (NILM)    DIAGPAP  11/01/2017 0000    NEGATIVE FOR INTRAEPITHELIAL LESIONS OR MALIGNANCY.   ADEQPAP  03/21/2021 1351    Satisfactory for evaluation; transformation zone component PRESENT.   ADEQPAP  11/01/2017 0000    Satisfactory for evaluation  endocervical/transformation zone component PRESENT.    OB History  Gravida Para Term Preterm AB Living  6 2 2  0 4 2  SAB IAB Ectopic Multiple Live Births  4 0 0 0 2    # Outcome Date GA Lbr Len/2nd Weight Sex Type Anes PTL Lv  6 Term 03/1992 [redacted]w[redacted]d   F VBAC   LIV  5 Term 02/1988 [redacted]w[redacted]d  8 lb 11 oz (3.941 kg) F Vag-Spont   LIV  4 SAB           3 SAB           2 SAB           1 SAB             Past Medical History:  Diagnosis Date   Actinic keratosis 01/17/2008   ALLERGIC RHINITIS 11/07/2006  ASTHMA 11/07/2006   Asthma    COLONIC POLYPS, HX OF 11/07/2006   DIVERTICULOSIS, COLON 11/07/2006   Hypertension    Recurrent upper respiratory infection (URI)    Thyroid  disease    hypothyroid    Past Surgical History:  Procedure Laterality Date   CESAREAN SECTION      Current Outpatient Medications on File Prior to Visit  Medication Sig Dispense Refill   albuterol  (VENTOLIN  HFA) 108 (90 Base) MCG/ACT inhaler Inhale 1-2 puffs into the lungs every 4 (four) hours as needed for wheezing or shortness of breath. 54 g 1   amLODipine  (NORVASC ) 2.5 MG tablet Take 2.5 mg by mouth daily.     budesonide  (PULMICORT ) 180 MCG/ACT inhaler Inhale 2 puffs into the lungs daily. 1 each 6   Calcium Carbonate-Vitamin D 600-400 MG-UNIT tablet Take 1 tablet by mouth 2 (two) times daily.       fluticasone  (FLOVENT  HFA) 110 MCG/ACT inhaler 1 puff 2 (two) times daily.     Fluticasone  Propionate (XHANCE ) 93 MCG/ACT EXHU Place 1 spray into the nose daily. 16 mL 5   levothyroxine  (SYNTHROID ) 75 MCG tablet TAKE 1 TABLET DAILY 90 tablet 0   Loratadine (CLARITIN PO) Take by mouth.     losartan (COZAAR) 50 MG tablet TK 1 T PO QD     triamcinolone  cream (KENALOG) 0.1 % SMARTSIG:1  Application Topical 2-3 Times Daily     montelukast  (SINGULAIR ) 10 MG tablet Take 1 tablet (10 mg total) by mouth at bedtime. (Patient not taking: Reported on 07/11/2023) 90 tablet 1   zolpidem  (AMBIEN ) 10 MG tablet Take 10 mg by mouth at bedtime as needed. (Patient not taking: Reported on 07/11/2023)     No current facility-administered medications on file prior to visit.    Social History   Socioeconomic History   Marital status: Married    Spouse name: Not on file   Number of children: Not on file   Years of education: Not on file   Highest education level: Not on file  Occupational History   Not on file  Tobacco Use   Smoking status: Never   Smokeless tobacco: Never  Vaping Use   Vaping status: Never Used  Substance and Sexual Activity   Alcohol use: Yes    Alcohol/week: 5.0 standard drinks of alcohol    Types: 5 Glasses of wine per week    Comment: wine   Drug use: No   Sexual activity: Yes    Birth control/protection: Post-menopausal  Other Topics Concern   Not on file  Social History Narrative   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 [employer] Situation: lives with husband - youngest is graduating soon (2016)      Spiritual Beliefs: episcopalian      Lifestyle:  Regular walking; diet is so so      Social Drivers of Corporate investment banker Strain: Not on file  Food Insecurity: Not on file  Transportation Needs: Not on file  Physical Activity: Not on file  Stress: Not on file  Social Connections: Not on file  Intimate Partner Violence: Not on file    Family History  Problem Relation Age of Onset   Celiac disease Mother        sister   Allergic rhinitis Mother    Gout Father    Cancer Father        bladder   Celiac disease Sister  Allergic rhinitis Sister    Arthritis Other    Asthma Neg Hx    Eczema Neg Hx    Urticaria Neg Hx      No Known Allergies    Patient's last menstrual period was  Patient's last menstrual period was 01/03/2007 (approximate)..            Review of Systems Alls systems reviewed and are negative.     Physical Exam Constitutional:      Appearance: Normal appearance.  Genitourinary:     Vulva and urethral meatus normal.     No lesions in the vagina.     Right Labia: No rash, lesions or skin changes.    Left Labia: No lesions, skin changes or rash.    No vaginal discharge or tenderness.     No vaginal prolapse present.    Moderate vaginal atrophy present.     Right Adnexa: not tender, not palpable and no mass present.    Left Adnexa: not tender, not palpable and no mass present.    No cervical motion tenderness or discharge.     Uterus is not enlarged, tender or irregular.  Breasts:    Right: Normal.     Left: Normal.  HENT:     Head: Normocephalic.  Neck:     Thyroid : No thyroid  mass, thyromegaly or thyroid  tenderness.  Cardiovascular:     Rate and Rhythm: Normal rate and regular rhythm.     Heart sounds: Normal heart sounds, S1 normal and S2 normal.  Pulmonary:     Effort: Pulmonary effort is normal.     Breath sounds: Normal breath sounds and air entry.  Abdominal:     General: There is no distension.     Palpations: Abdomen is soft. There is no mass.     Tenderness: There is no abdominal tenderness. There is no guarding or rebound.  Musculoskeletal:        General: Normal range of motion.     Cervical back: Full passive range of motion without pain, normal range of motion and neck supple. No tenderness.     Right lower leg: No edema.     Left lower leg: No edema.  Neurological:     Mental Status: She is alert.  Skin:    General: Skin is warm.  Psychiatric:        Mood and Affect: Mood normal.        Behavior: Behavior normal.        Thought Content: Thought content normal.  Vitals and nursing note reviewed. Exam conducted with a chaperone present.       A:         Well Woman GYN exam, medicare                              P:        Pap smear not indicated Encouraged annual mammogram screening Colon cancer screening up-to-date DXA ordered today Labs and immunizations to do with PMD Discussed breast self exams Encouraged healthy lifestyle practices Encouraged Vit D and Calcium   No follow-ups on file.  Jasmin Small

## 2023-07-12 ENCOUNTER — Telehealth: Payer: Self-pay

## 2023-07-12 NOTE — Telephone Encounter (Signed)
 A prior authorization has been submitted for Intrarosa  6.5 MG inserts today.  KEY: [id number] Your information has been sent to [facility]. If [facility] has not replied to your request within 24 hours for urgent requests or 72 hours for standard requests, please reach out to the plan using the phone number located on the back on the [employer] card.

## 2023-07-13 ENCOUNTER — Telehealth: Payer: Self-pay

## 2023-07-13 NOTE — Telephone Encounter (Signed)
 Prior authorization for Intrarosa  was approved.

## 2023-07-31 NOTE — Telephone Encounter (Signed)
 Outcome: Approved on July 10 by [facility] Medicare 2017  This drug has been approved under the [facility] Medicare Part D benefit for INTRAROSA  Insert 6.5MG . Approved quantity: 28 per 28 day(s). You may fill up to a 90 day supply except for those on Specialty Tier 5, which can be filled up to a 30 day supply. Please call the pharmacy to process the prescription claim.  Effective Date: 07/11/2023 Authorization Expiration Date: 01/01/2098

## 2023-11-19 ENCOUNTER — Other Ambulatory Visit: Payer: Self-pay | Admitting: Internal Medicine

## 2023-11-19 DIAGNOSIS — Z1231 Encounter for screening mammogram for malignant neoplasm of breast: Secondary | ICD-10-CM

## 2023-11-20 ENCOUNTER — Ambulatory Visit: Payer: Self-pay | Admitting: Obstetrics and Gynecology

## 2023-11-20 ENCOUNTER — Ambulatory Visit (HOSPITAL_BASED_OUTPATIENT_CLINIC_OR_DEPARTMENT_OTHER)
Admission: RE | Admit: 2023-11-20 | Discharge: 2023-11-20 | Disposition: A | Discharge status: Home / Self Care | Source: Ambulatory Visit | Attending: Obstetrics and Gynecology | Admitting: Obstetrics and Gynecology

## 2023-11-20 DIAGNOSIS — E2839 Other primary ovarian failure: Secondary | ICD-10-CM | POA: Insufficient documentation

## 2023-11-20 DIAGNOSIS — Z01419 Encounter for gynecological examination (general) (routine) without abnormal findings: Secondary | ICD-10-CM | POA: Diagnosis present

## 2023-11-28 NOTE — Telephone Encounter (Signed)
 Spoke with patient, concerns addressed.   Encounter closed.

## 2023-12-17 ENCOUNTER — Ambulatory Visit
Admission: RE | Admit: 2023-12-17 | Discharge: 2023-12-17 | Disposition: A | Discharge status: Home / Self Care | Source: Ambulatory Visit | Attending: Internal Medicine | Admitting: Internal Medicine

## 2023-12-17 DIAGNOSIS — Z1231 Encounter for screening mammogram for malignant neoplasm of breast: Secondary | ICD-10-CM
